# Patient Record
Sex: Female | Born: 1990 | Race: Black or African American | Hispanic: No | Marital: Single | Smoking: Former smoker
Health system: Southern US, Community
[De-identification: ages and names within clinical notes are randomized; demographics above are authoritative.]

## PROBLEM LIST (undated history)

## (undated) DIAGNOSIS — G43909 Migraine, unspecified, not intractable, without status migrainosus: Secondary | ICD-10-CM

## (undated) DIAGNOSIS — N939 Abnormal uterine and vaginal bleeding, unspecified: Secondary | ICD-10-CM

## (undated) HISTORY — DX: Migraine, unspecified, not intractable, without status migrainosus: G43.909

## (undated) HISTORY — DX: Abnormal uterine and vaginal bleeding, unspecified: N93.9

---
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
## 2010-02-23 ENCOUNTER — Emergency Department: Payer: Self-pay | Admitting: Emergency Medicine

## 2011-11-29 HISTORY — PX: PERIPHERAL VASCULAR THROMBECTOMY: CATH118306

## 2011-12-14 ENCOUNTER — Emergency Department: Payer: Self-pay | Admitting: *Deleted

## 2011-12-26 ENCOUNTER — Emergency Department: Payer: Self-pay | Admitting: Emergency Medicine

## 2012-03-02 ENCOUNTER — Emergency Department: Payer: Self-pay | Admitting: Emergency Medicine

## 2012-03-02 LAB — HCG, QUANTITATIVE, PREGNANCY: Beta Hcg, Quant.: <1 m[IU]/mL — ABNORMAL LOW

## 2012-04-26 ENCOUNTER — Emergency Department: Payer: Self-pay | Admitting: Emergency Medicine

## 2012-04-26 LAB — WET PREP, GENITAL

## 2012-04-26 LAB — URINALYSIS, COMPLETE
Bilirubin,UR: NEGATIVE
Blood: NEGATIVE
Glucose,UR: NEGATIVE mg/dL (ref 0–75)
Ketone: NEGATIVE
Nitrite: NEGATIVE
Ph: 6 (ref 4.5–8.0)
RBC,UR: 3 /HPF (ref 0–5)
Specific Gravity: 1.027 (ref 1.003–1.030)
WBC UR: 1 /HPF (ref 0–5)

## 2012-04-26 LAB — HCG, QUANTITATIVE, PREGNANCY: Beta Hcg, Quant.: 35643 m[IU]/mL — ABNORMAL HIGH

## 2013-07-24 ENCOUNTER — Emergency Department: Payer: Self-pay | Admitting: Emergency Medicine

## 2013-11-28 HISTORY — PX: HAND SURGERY: SHX662
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
## 2014-08-12 ENCOUNTER — Emergency Department: Payer: Self-pay | Admitting: General Practice

## 2014-08-19 ENCOUNTER — Emergency Department: Payer: Self-pay | Admitting: Student

## 2015-02-06 ENCOUNTER — Ambulatory Visit: Payer: Self-pay | Admitting: Specialist

## 2015-03-23 LAB — SURGICAL PATHOLOGY

## 2015-03-29 NOTE — Op Note (Signed)
PATIENT NAME:  Peggy Shaw, Peggy Shaw MR#:  [id number] DATE OF BIRTH:  October 17, 1991  DATE OF PROCEDURE:  02/06/2015  PREOPERATIVE DIAGNOSIS: Dorsal right wrist mass.  POSTOPERATIVE DIAGNOSIS: Dorsal right wrist mass.   OPERATIONAL PROCEDURE PERFORMED: Excision of dorsal right wrist mass.   SURGEON: Clare Gandyhristopher E. Gabrianna Fassnacht, MD   ANESTHESIA: General.   COMPLICATIONS: None.   TOURNIQUET TIME: 24 minutes.   DESCRIPTION OF PROCEDURE: Ancef 1 gram was given intravenously prior to the procedure. General anesthesia is induced. The right upper extremity is thoroughly prepped Betadine and alcohol, and draped in standard sterile fashion. The extremity is wrapped out with the Esmarch bandage and pneumatic tourniquet elevated to 250 mmHg. Transverse incision is then made under loupe magnification over the mid portion of the mass, which is directly in the center of the dorsum of the right wrist. The dissection is carefully carried down to the retinaculum, which is split transversely. The mass is seen to be a 1.5 cm x 1 cm reddish-brown hard mass. This was completely dissected out. This does not appear to have any extension down into the wrist capsule or wrist joint. Specimen is sent to pathology. The wound is thoroughly irrigated multiple times. Retinaculum is repaired with one 5-0 Vicryl, 1 subcutaneous 5-0 Vicryl suture is placed, a running subcuticular 3-0 Prolene is placed. Skin edges are infiltrated with 0.5% plain Xylocaine. A soft bulky dressing with a volar splint is applied. The patient is returned to the recovery room in satisfactory condition having tolerated the procedure quite well.     ____________________________ Clare Gandyhristopher E. Jacory Kamel, MD ces:mw D: 02/06/2015 08:49:56 ET T: 02/06/2015 17:00:04 ET JOB#: [id number]  cc: Clare Gandyhristopher E. Jeriah Corkum, MD, <Dictator> Clare GandyHRISTOPHER E Jianni Batten MD ELECTRONICALLY SIGNED 02/07/2015 10:07

## 2015-05-27 IMAGING — CR DG CHEST 2V
1 series · 2 of 2 positions shown · non-contrast
Comparison: none

REASON FOR EXAM: cough
COMMENTS:   LMP: One week ago

[Series 1: w chest pa · 0.14mm/px · 2 of 2 slices shown]
[im 1/2]
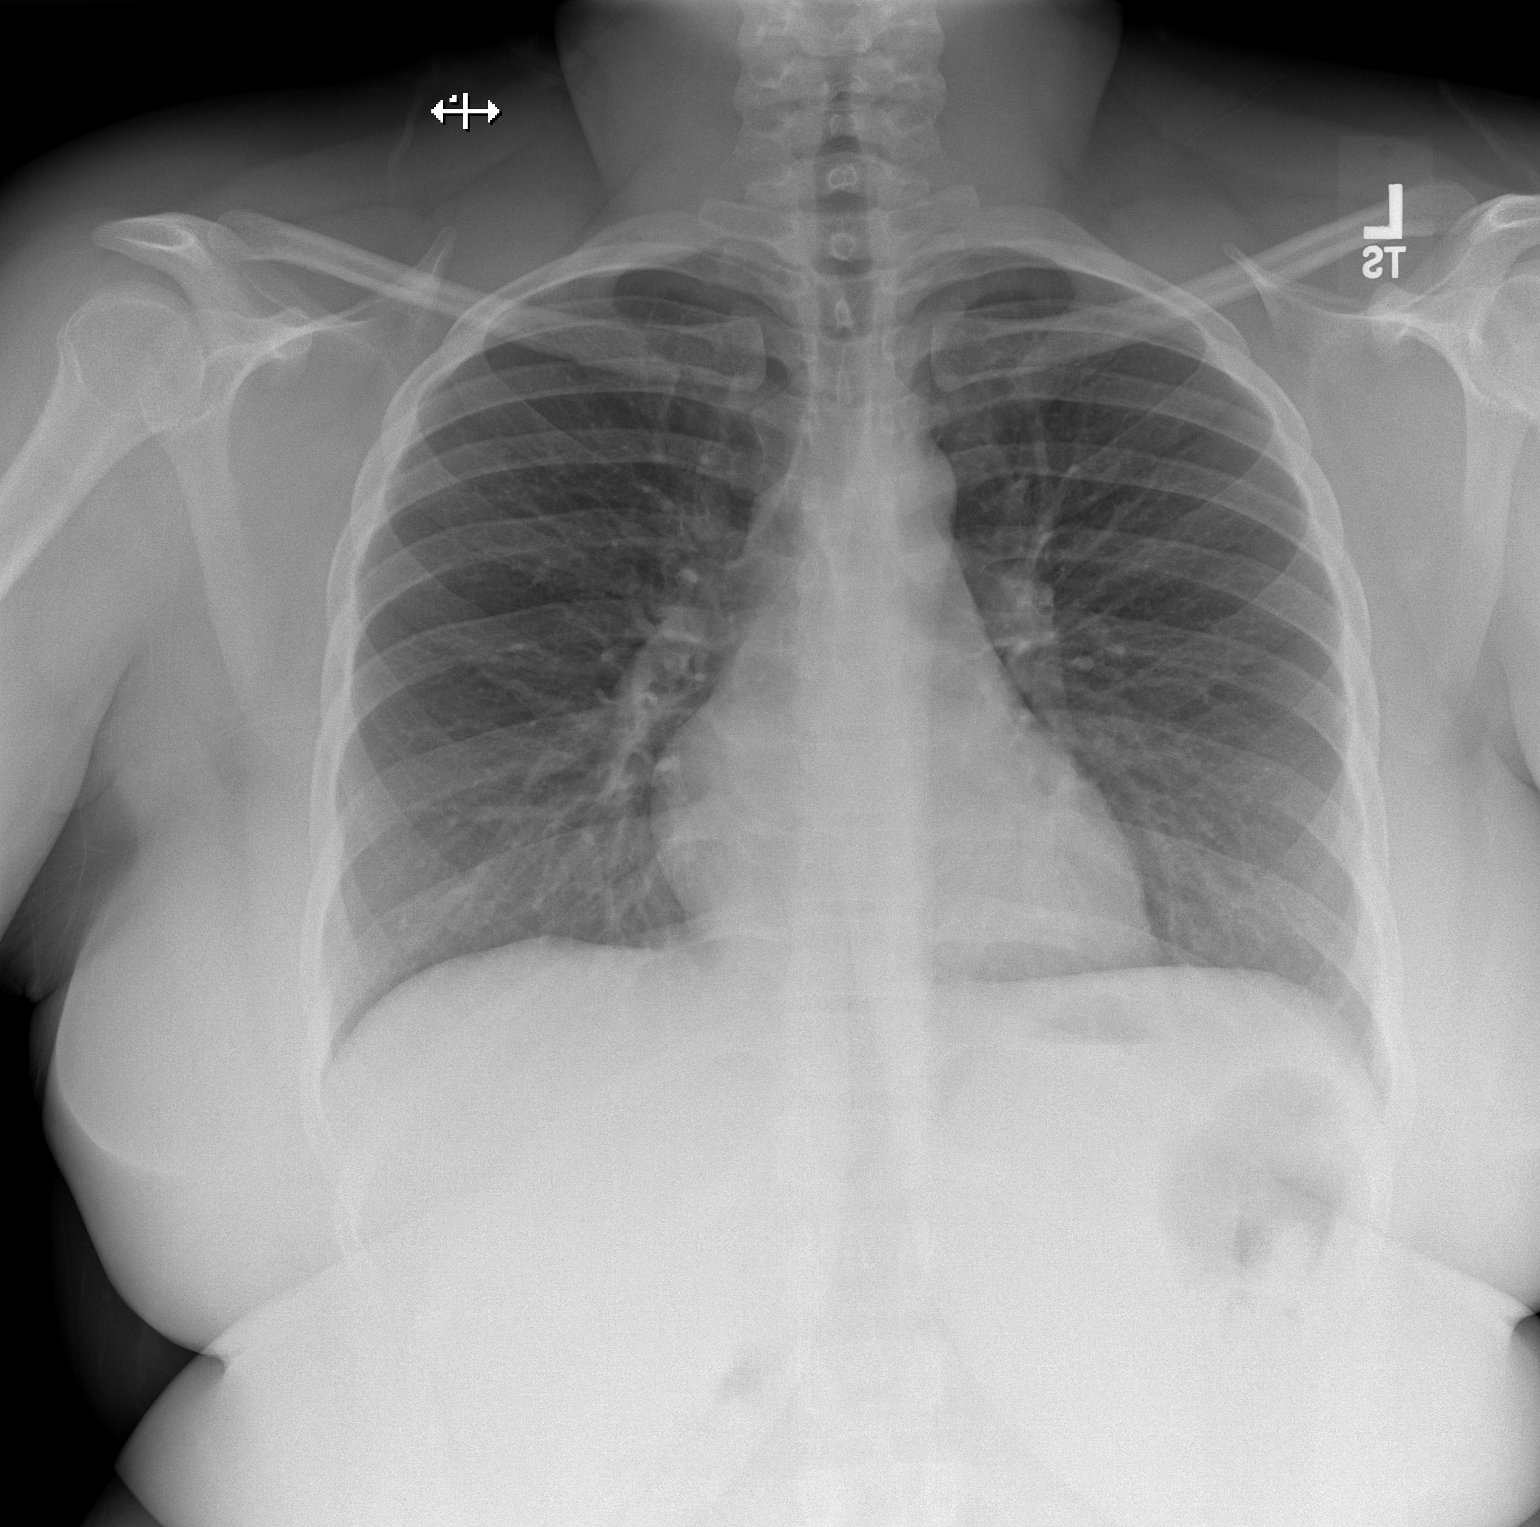
[im 2/2]
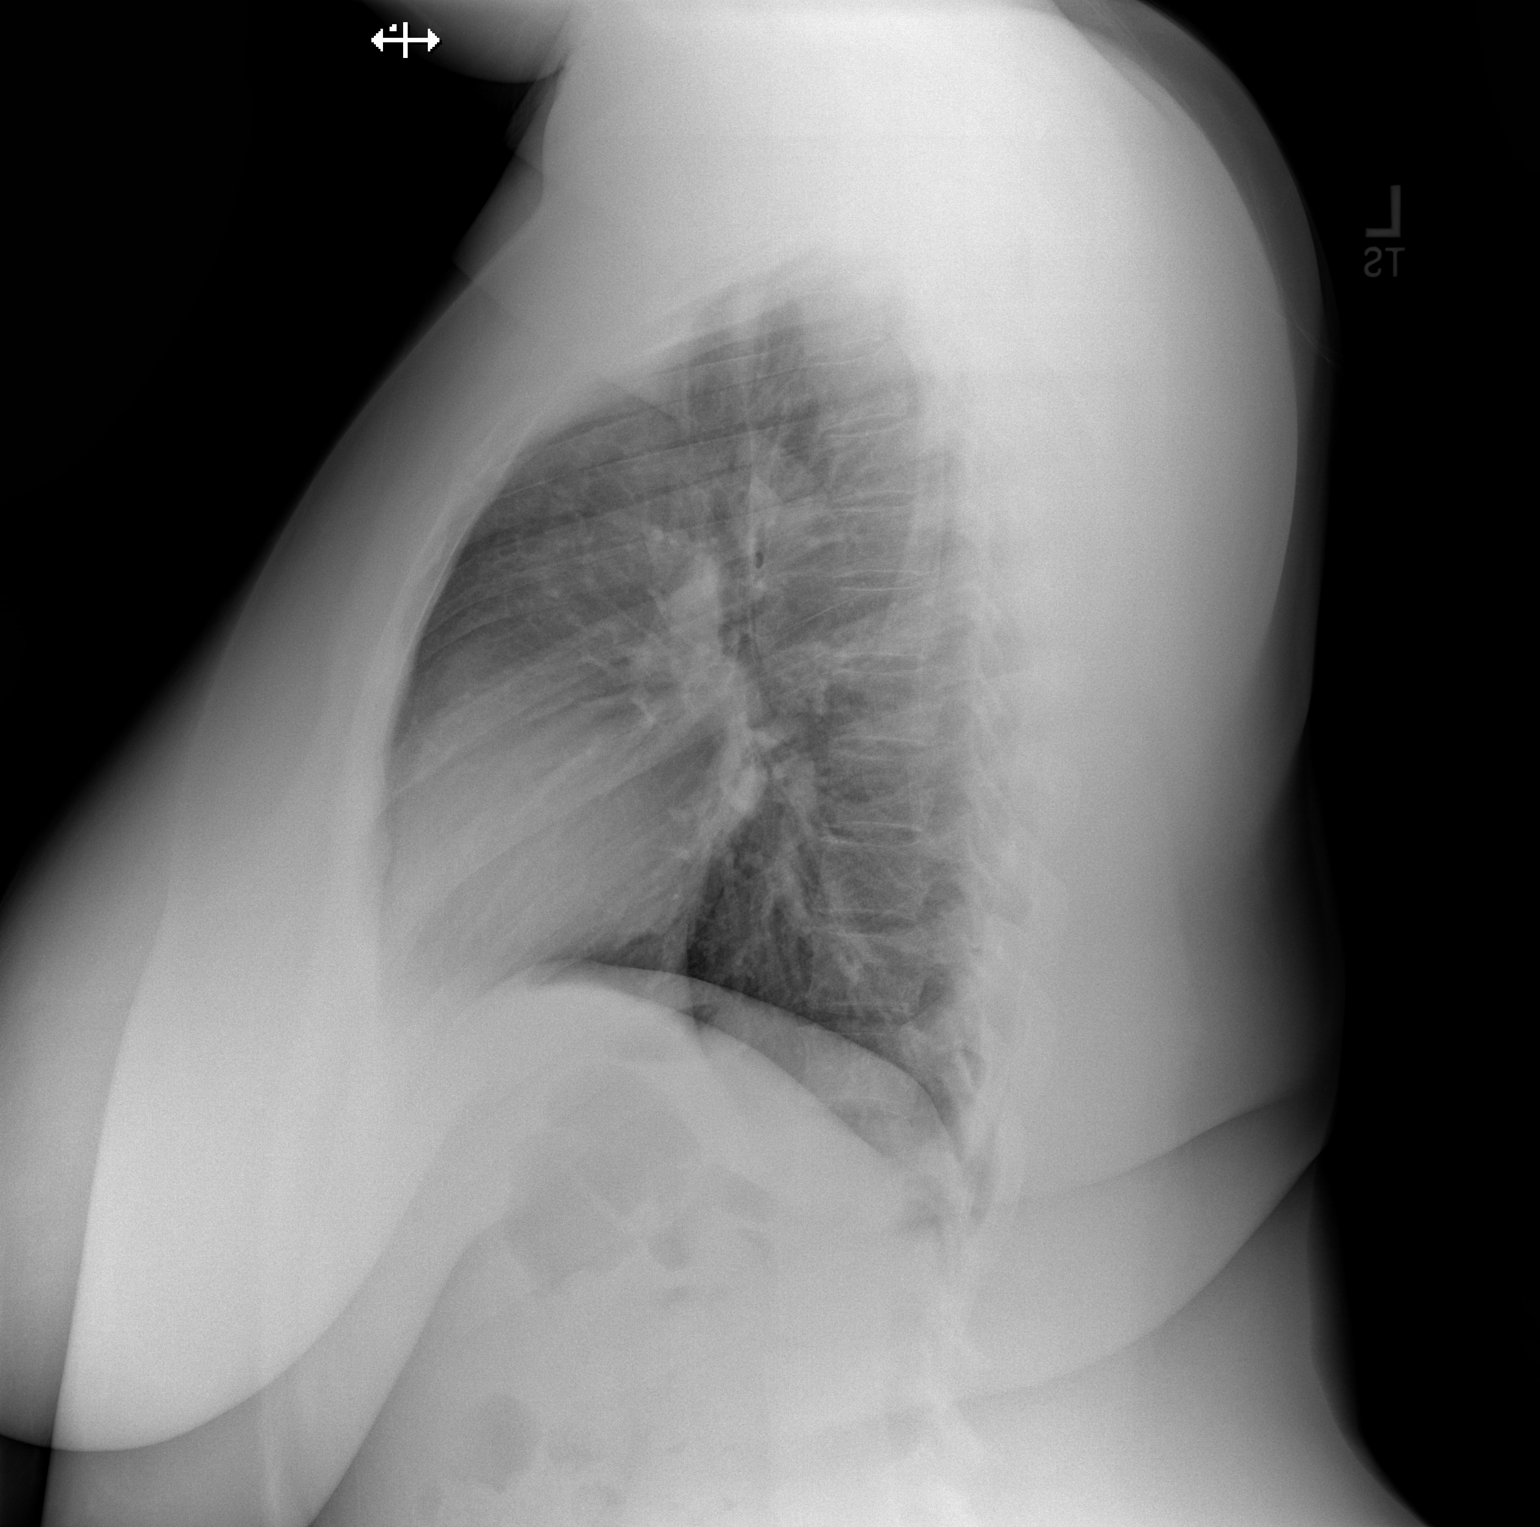

[2 of 2 positions shown; findings below may reference images not displayed]

PROCEDURE:     DXR - DXR CHEST PA (OR AP) AND LATERAL  - July 24, 2013 [DATE]

RESULT:     The lungs are adequately inflated and clear. The cardiac
silhouette is normal in size. The pulmonary vascularity is not engorged. The
mediastinum is normal in width. The bony thorax exhibits no acute
abnormality.
IMPRESSION: There is no evidence of pneumonia nor CHF nor other acute
cardiopulmonary abnormality.

[REDACTED]

## 2016-04-21 ENCOUNTER — Encounter: Payer: Self-pay | Admitting: Emergency Medicine

## 2016-04-21 ENCOUNTER — Emergency Department
Admission: EM | Admit: 2016-04-21 | Discharge: 2016-04-21 | Disposition: A | Discharge status: Home / Self Care | Payer: 59 | Attending: Emergency Medicine | Admitting: Emergency Medicine

## 2016-04-21 DIAGNOSIS — J020 Streptococcal pharyngitis: Secondary | ICD-10-CM | POA: Insufficient documentation

## 2016-04-21 DIAGNOSIS — F17200 Nicotine dependence, unspecified, uncomplicated: Secondary | ICD-10-CM | POA: Insufficient documentation

## 2016-04-21 DIAGNOSIS — R509 Fever, unspecified: Secondary | ICD-10-CM | POA: Diagnosis present

## 2016-04-21 LAB — POCT RAPID STREP A: STREPTOCOCCUS, GROUP A SCREEN (DIRECT): NEGATIVE

## 2016-04-21 MED ORDER — ACETAMINOPHEN 160 MG/5ML PO SOLN
650.0000 mg | Freq: Once | ORAL | Status: AC
  Administered 2016-04-21: 650 mg via ORAL

## 2016-04-21 MED ORDER — LIDOCAINE VISCOUS 2 % MT SOLN
15.0000 mL | Freq: Once | OROMUCOSAL | Status: AC
  Administered 2016-04-21: 15 mL via OROMUCOSAL
  Filled 2016-04-21: qty 15

## 2016-04-21 MED ORDER — ACETAMINOPHEN 160 MG/5ML PO SUSP
ORAL | Status: AC
  Administered 2016-04-21: 650 mg via ORAL
  Filled 2016-04-21: qty 20

## 2016-04-21 MED ORDER — ACETAMINOPHEN 500 MG PO TABS: 1000.0000 mg | ORAL_TABLET | Freq: Once | ORAL | Status: DC

## 2016-04-21 MED ORDER — PENICILLIN G BENZATHINE 1200000 UNIT/2ML IM SUSP
1.2000 10*6.[IU] | Freq: Once | INTRAMUSCULAR | Status: AC
  Administered 2016-04-21: 1.2 10*6.[IU] via INTRAMUSCULAR
  Filled 2016-04-21: qty 2

## 2016-04-21 NOTE — ED Provider Notes (Signed)
[facility] Emergency Department Provider Note ____________________________________________  Time seen: 59  I have reviewed the triage vital signs and the nursing notes.  HISTORY  Chief Complaint  Sore Throat; Otalgia; and Generalized Body Aches   HPI Peggy Shaw is a 25 y.o. female resistance to the ED for sudden onset of sore throat pain yesterday. She was unaware of fevers but found to be febrile upon registration. She gives a prior history of strep infection. She denies any sick Contacts or recent travel. She has only taking Benadryl for symptom relief. She left work early yesterday secondary to her symptoms.  History reviewed. No pertinent past medical history.  There are no active problems to display for this patient.  History reviewed. No pertinent past surgical history.  No current outpatient prescriptions on file.  Allergies Review of patient's allergies indicates no known allergies.  History reviewed. No pertinent family history.  Social History Social History  Substance Use Topics  . Smoking status: Current Every Day Smoker  . Smokeless tobacco: None  . Alcohol Use: Yes   Review of Systems  Constitutional: Positive for fever. Eyes: Negative for visual changes. ENT: Positive for sore throat. Cardiovascular: Negative for chest pain. Respiratory: Negative for shortness of breath. Gastrointestinal: Negative for abdominal pain, vomiting and diarrhea. Skin: Negative for rash. Neurological: Negative for headaches, focal weakness or numbness. ____________________________________________  PHYSICAL EXAM:  VITAL SIGNS: ED Triage Vitals  Enc Vitals Group     BP 04/21/16 0947 118/59 mmHg     Pulse Rate 04/21/16 0947 110     Resp 04/21/16 0947 20     Temp 04/21/16 0947 101.2 F (38.4 C)     Temp Source 04/21/16 0947 Oral     SpO2 04/21/16 0947 97 %     Weight 04/21/16 0947 235 lb (106.595 kg)     Height 04/21/16 0947  (1.626  m)     Head Cir --      Peak Flow --      Pain Score 04/21/16 0949 10     Pain Loc --      Pain Edu? --      Excl. in GC? --    Constitutional: Alert and oriented. Well appearing and in no distress. Head: Normocephalic and atraumatic.      Eyes: Conjunctivae are normal. PERRL. Normal extraocular movements      Ears: Canals clear. TMs intact bilaterally.   Nose: No congestion/rhinorrhea.   Mouth/Throat: Mucous membranes are moist. Uvula is midline. Tonsils are enlarged, erythematous, and exudative.    Neck: Supple. No thyromegaly. Hematological/Lymphatic/Immunological: palpable anterior cervical lymphadenopathy. Cardiovascular: Normal rate, regular rhythm.  Respiratory: Normal respiratory effort. No wheezes/rales/rhonchi. Gastrointestinal: Soft and nontender. No distention. Musculoskeletal: Nontender with normal range of motion in all extremities.  Neurologic:  Normal gait without ataxia. Normal speech and language. No gross focal neurologic deficits are appreciated. Skin:  Skin is warm, dry and intact. No rash noted. ____________________________________________   LABS (pertinent positives/negatives) Labs Reviewed  CULTURE, GROUP A STREP [facility])  POCT RAPID STREP A  ____________________________________________  PROCEDURES  Bicillin LA 1.2 million units IM Tylenol Suspension 650 mg PO Viscous lidocaine 2% swish & spit ____________________________________________  INITIAL IMPRESSION / ASSESSMENT AND PLAN / ED COURSE  Patient with treatment for an acute strep tonsillitis despite negative rapid strep test. Clinical presentation is consistent with strep pharyngitis. She is given Bicillin IM in the ED. She will continue to monitor and treat fevers as appropriate. She will  [facility]. ____________________________________________  FINAL CLINICAL IMPRESSION(S) / ED DIAGNOSES  Final diagnoses:  Strep sore throat     Lissa HoardJenise V Bacon  Gracelee Stemmler, PA-C 04/21/16 1103  Phineas SemenGraydon Goodman, MD 04/21/16 1331

## 2016-04-21 NOTE — Discharge Instructions (Signed)
Strep Throat °Strep throat is a bacterial infection of the throat. Your health care provider may call the infection tonsillitis or pharyngitis, depending on whether there is swelling in the tonsils or at the back of the throat. Strep throat is most common during the cold months of the year in children who are 5-25 years of age, but it can happen during any season in people of any age. This infection is spread from person to person (contagious) through coughing, sneezing, or close contact. °CAUSES °Strep throat is caused by the bacteria called Streptococcus pyogenes. °RISK FACTORS °This condition is more likely to develop in: °· People who spend time in crowded places where the infection can spread easily. °· People who have close contact with someone who has strep throat. °SYMPTOMS °Symptoms of this condition include: °· Fever or chills.   °· Redness, swelling, or pain in the tonsils or throat. °· Pain or difficulty when swallowing. °· White or yellow spots on the tonsils or throat. °· Swollen, tender glands in the neck or under the jaw. °· Red rash all over the body (rare). °DIAGNOSIS °This condition is diagnosed by performing a rapid strep test or by taking a swab of your throat (throat culture test). Results from a rapid strep test are usually ready in a few minutes, but throat culture test results are available after one or two days. °TREATMENT °This condition is treated with antibiotic medicine. °HOME CARE INSTRUCTIONS °Medicines °· Take over-the-counter and prescription medicines only as told by your health care provider. °· Take your antibiotic as told by your health care provider. Do not stop taking the antibiotic even if you start to feel better. °· Have family members who also have a sore throat or fever tested for strep throat. They may need antibiotics if they have the strep infection. °Eating and Drinking °· Do not share food, drinking cups, or personal items that could cause the infection to spread to  other people. °· If swallowing is difficult, try eating soft foods until your sore throat feels better. °· Drink enough fluid to keep your urine clear or pale yellow. °General Instructions °· Gargle with a salt-water mixture 3-4 times per day or as needed. To make a salt-water mixture, completely dissolve ½-1 tsp of salt in 1 cup of warm water. °· Make sure that all household members wash their hands well. °· Get plenty of rest. °· Stay home from school or work until you have been taking antibiotics for 24 hours. °· Keep all follow-up visits as told by your health care provider. This is important. °SEEK MEDICAL CARE IF: °· The glands in your neck continue to get bigger. °· You develop a rash, cough, or earache. °· You cough up a thick liquid that is green, yellow-brown, or bloody. °· You have pain or discomfort that does not get better with medicine. °· Your problems seem to be getting worse rather than better. °· You have a fever. °SEEK IMMEDIATE MEDICAL CARE IF: °· You have new symptoms, such as vomiting, severe headache, stiff or painful neck, chest pain, or shortness of breath. °· You have severe throat pain, drooling, or changes in your voice. °· You have swelling of the neck, or the skin on the neck becomes red and tender. °· You have signs of dehydration, such as fatigue, dry mouth, and decreased urination. °· You become increasingly sleepy, or you cannot wake up completely. °· Your joints become red or painful. °  °This information is not intended to replace   advice given to you by your health care provider. Make sure you discuss any questions you have with your health care provider.   Document Released: 11/11/2000 Document Revised: 08/05/2015 Document Reviewed: 03/09/2015 Elsevier Interactive Patient Education 2016 [employer]   You have been treated for strep throat with a one-time antibiotic injection. Continue to monitor and treat fevers with Tylenol and Ibuprofen. Rinse with warm salty water,  and change your toothbrush in 24-hours. [facility] for continued symptoms. Your throat culture is pending.

## 2016-04-21 NOTE — ED Notes (Signed)
Pt to ed with c/o body aches, chills, sore throat and earache x 2 days.

## 2016-04-21 NOTE — ED Notes (Signed)
Sore throat with fever for couple of days  Increased pain with swallowing  Throat red and swollen

## 2016-04-23 LAB — CULTURE, GROUP A STREP (THRC)

## 2016-10-04 ENCOUNTER — Emergency Department: Payer: 59

## 2016-10-04 ENCOUNTER — Emergency Department
Admission: EM | Admit: 2016-10-04 | Discharge: 2016-10-04 | Disposition: A | Discharge status: Home / Self Care | Payer: 59 | Attending: Student in an Organized Health Care Education/Training Program | Admitting: Student in an Organized Health Care Education/Training Program

## 2016-10-04 ENCOUNTER — Encounter: Payer: Self-pay | Admitting: Emergency Medicine

## 2016-10-04 DIAGNOSIS — O209 Hemorrhage in early pregnancy, unspecified: Secondary | ICD-10-CM

## 2016-10-04 DIAGNOSIS — F17200 Nicotine dependence, unspecified, uncomplicated: Secondary | ICD-10-CM | POA: Insufficient documentation

## 2016-10-04 DIAGNOSIS — R102 Pelvic and perineal pain: Secondary | ICD-10-CM | POA: Insufficient documentation

## 2016-10-04 DIAGNOSIS — O26851 Spotting complicating pregnancy, first trimester: Secondary | ICD-10-CM | POA: Diagnosis not present

## 2016-10-04 DIAGNOSIS — O99331 Smoking (tobacco) complicating pregnancy, first trimester: Secondary | ICD-10-CM | POA: Insufficient documentation

## 2016-10-04 DIAGNOSIS — Z3A01 Less than 8 weeks gestation of pregnancy: Secondary | ICD-10-CM | POA: Diagnosis not present

## 2016-10-04 DIAGNOSIS — N939 Abnormal uterine and vaginal bleeding, unspecified: Secondary | ICD-10-CM

## 2016-10-04 LAB — COMPREHENSIVE METABOLIC PANEL
ALT: 13 U/L — ABNORMAL LOW (ref 14–54)
ANION GAP: 6 (ref 5–15)
AST: 15 U/L (ref 15–41)
Albumin: 3.7 g/dL (ref 3.5–5.0)
Alkaline Phosphatase: 77 U/L (ref 38–126)
BUN: 16 mg/dL (ref 6–20)
CHLORIDE: 103 mmol/L (ref 101–111)
CO2: 25 mmol/L (ref 22–32)
Calcium: 9.4 mg/dL (ref 8.9–10.3)
Creatinine, Ser: 0.77 mg/dL (ref 0.44–1.00)
Glucose, Bld: 101 mg/dL — ABNORMAL HIGH (ref 65–99)
POTASSIUM: 3.7 mmol/L (ref 3.5–5.1)
Sodium: 134 mmol/L — ABNORMAL LOW (ref 135–145)
Total Bilirubin: <0.1 mg/dL — ABNORMAL LOW (ref 0.3–1.2)
Total Protein: 7.2 g/dL (ref 6.5–8.1)

## 2016-10-04 LAB — URINALYSIS COMPLETE WITH MICROSCOPIC (ARMC ONLY)
BACTERIA UA: NONE SEEN
BILIRUBIN URINE: NEGATIVE
Glucose, UA: NEGATIVE mg/dL
Ketones, ur: NEGATIVE mg/dL
Nitrite: NEGATIVE
PH: 6 (ref 5.0–8.0)
Protein, ur: NEGATIVE mg/dL
Specific Gravity, Urine: 1.019 (ref 1.005–1.030)

## 2016-10-04 LAB — HCG, QUANTITATIVE, PREGNANCY: HCG, BETA CHAIN, QUANT, S: 66934 m[IU]/mL — AB (ref <5)

## 2016-10-04 LAB — CBC
HEMATOCRIT: 33.8 % — AB (ref 35.0–47.0)
HEMOGLOBIN: 11 g/dL — AB (ref 12.0–16.0)
MCH: 25.9 pg — ABNORMAL LOW (ref 26.0–34.0)
MCHC: 32.7 g/dL (ref 32.0–36.0)
MCV: 79.1 fL — AB (ref 80.0–100.0)
PLATELETS: 333 10*3/uL (ref 150–440)
RBC: 4.27 MIL/uL (ref 3.80–5.20)
RDW: 15.7 % — ABNORMAL HIGH (ref 11.5–14.5)
WBC: 9.7 10*3/uL (ref 3.6–11.0)

## 2016-10-04 LAB — LIPASE, BLOOD: LIPASE: 26 U/L (ref 11–51)

## 2016-10-04 LAB — ABO/RH: ABO/RH(D): B POS

## 2016-10-04 LAB — POCT PREGNANCY, URINE: Preg Test, Ur: POSITIVE — AB

## 2016-10-04 MED ORDER — ACETAMINOPHEN 500 MG PO TABS
1000.0000 mg | ORAL_TABLET | Freq: Once | ORAL | Status: AC
  Administered 2016-10-04: 1000 mg via ORAL
  Filled 2016-10-04: qty 2

## 2016-10-04 NOTE — ED Provider Notes (Signed)
[facility] Emergency Department Provider Note    First MD Initiated Contact with Patient 10/04/16 1859     (approximate)  I have reviewed the triage vital signs and the nursing notes.   HISTORY  Chief Complaint Abdominal Pain    HPI Peggy Shaw is a 25 y.o. female G2P1 who presents with 3 days of lower back pain as well as lower abdominal pain the patient states is consistent with uterine cramping.  States her last missed a cycle was roughly 2 months ago. States she has a history of irregular menses.No history of bleeding disorders.   History reviewed. No pertinent past medical history.  There are no active problems to display for this patient.   History reviewed. No pertinent surgical history.  Prior to Admission medications   Not on File    Allergies Patient has no known allergies.  No fam h/o bleeding disorders  Social History Social History  Substance Use Topics  . Smoking status: Current Every Day Smoker  . Smokeless tobacco: Never Used  . Alcohol use Yes    Review of Systems Patient denies headaches, rhinorrhea, blurry vision, numbness, shortness of breath, chest pain, edema, cough, abdominal pain, nausea, vomiting, diarrhea, dysuria, fevers, rashes or hallucinations unless otherwise stated above in HPI. ____________________________________________   PHYSICAL EXAM:  VITAL SIGNS: Vitals:   10/04/16 1716 10/04/16 2110  BP: 125/60 118/66  Pulse: 72 90  Resp: 18 16  Temp: 98.4 F (36.9 C) 98.3 F (36.8 C)    Constitutional: Alert and oriented. Well appearing and in no acute distress. Eyes: Conjunctivae are normal. PERRL. EOMI. Head: Atraumatic. Nose: No congestion/rhinnorhea. Mouth/Throat: Mucous membranes are moist.  Oropharynx non-erythematous. Neck: No stridor. Painless ROM. No cervical spine tenderness to palpation Hematological/Lymphatic/Immunilogical: No cervical lymphadenopathy. Cardiovascular: Normal rate,  regular rhythm. Grossly normal heart sounds.  Good peripheral circulation. Respiratory: Normal respiratory effort.  No retractions. Lungs CTAB. Gastrointestinal: Soft and nontender. No distention. No abdominal bruits. No CVA tenderness. Genitourinary:  Musculoskeletal: No lower extremity tenderness nor edema.  No joint effusions. Neurologic:  Normal speech and language. No gross focal neurologic deficits are appreciated. No gait instability. Skin:  Skin is warm, dry and intact. No rash noted. Psychiatric: Mood and affect are normal. Speech and behavior are normal.  ____________________________________________   LABS (all labs ordered are listed, but only abnormal results are displayed)  Results for orders placed or performed during the hospital encounter of 10/04/16 (from the past 24 hour(s))  Lipase, blood     Status: None   Collection Time: 10/04/16  5:21 PM  Result Value Ref Range   Lipase 26 11 - 51 U/L  Comprehensive metabolic panel     Status: Abnormal   Collection Time: 10/04/16  5:21 PM  Result Value Ref Range   Sodium 134 (L) 135 - 145 mmol/L   Potassium 3.7 3.5 - 5.1 mmol/L   Chloride 103 101 - 111 mmol/L   CO2 25 22 - 32 mmol/L   Glucose, Bld 101 (H) 65 - 99 mg/dL   BUN 16 6 - 20 mg/dL   Creatinine, Ser 0.980.77 0.44 - 1.00 mg/dL   Calcium 9.4 8.9 - 11.910.3 mg/dL   Total Protein 7.2 6.5 - 8.1 g/dL   Albumin 3.7 3.5 - 5.0 g/dL   AST 15 15 - 41 U/L   ALT 13 (L) 14 - 54 U/L   Alkaline Phosphatase 77 38 - 126 U/L   Total Bilirubin <0.1 (L) 0.3 - 1.2  mg/dL   GFR calc non Af Amer >60 >60 mL/min   GFR calc Af Amer >60 >60 mL/min   Anion gap 6 5 - 15  CBC     Status: Abnormal   Collection Time: 10/04/16  5:21 PM  Result Value Ref Range   WBC 9.7 3.6 - 11.0 K/uL   RBC 4.27 3.80 - 5.20 MIL/uL   Hemoglobin 11.0 (L) 12.0 - 16.0 g/dL   HCT 16.133.8 (L) 09.635.0 - 04.547.0 %   MCV 79.1 (L) 80.0 - 100.0 fL   MCH 25.9 (L) 26.0 - 34.0 pg   MCHC 32.7 32.0 - 36.0 g/dL   RDW 40.915.7 (H) 81.111.5 - 91.414.5  %   Platelets 333 150 - 440 K/uL  Urinalysis complete, with microscopic     Status: Abnormal   Collection Time: 10/04/16  5:21 PM  Result Value Ref Range   Color, Urine YELLOW (A) YELLOW   APPearance HAZY (A) CLEAR   Glucose, UA NEGATIVE NEGATIVE mg/dL   Bilirubin Urine NEGATIVE NEGATIVE   Ketones, ur NEGATIVE NEGATIVE mg/dL   Specific Gravity, Urine 1.019 1.005 - 1.030   Hgb urine dipstick 3+ (A) NEGATIVE   pH 6.0 5.0 - 8.0   Protein, ur NEGATIVE NEGATIVE mg/dL   Nitrite NEGATIVE NEGATIVE   Leukocytes, UA 2+ (A) NEGATIVE   RBC / HPF 0-5 0 - 5 RBC/hpf   WBC, UA 0-5 0 - 5 WBC/hpf   Bacteria, UA NONE SEEN NONE SEEN   Squamous Epithelial / LPF 0-5 (A) NONE SEEN   Mucous PRESENT   hCG, quantitative, pregnancy     Status: Abnormal   Collection Time: 10/04/16  5:21 PM  Result Value Ref Range   hCG, Beta Chain, Quant, S [id number],29566,934 (H) <5 mIU/mL  ABO/Rh     Status: None   Collection Time: 10/04/16  5:21 PM  Result Value Ref Range   ABO/RH(D) B POS   Pregnancy, urine POC     Status: Abnormal   Collection Time: 10/04/16  5:27 PM  Result Value Ref Range   Preg Test, Ur POSITIVE (A) NEGATIVE   ____________________________________________  EKG____________________________________________  RADIOLOGY  I personally reviewed all radiographic images ordered to evaluate for the above acute complaints and reviewed radiology reports and findings.  These findings were personally discussed with the patient.  Please see medical record for radiology report.  ____________________________________________   PROCEDURES  Procedure(s) performed: none    Critical Care performed: no ____________________________________________   INITIAL IMPRESSION / ASSESSMENT AND PLAN / ED COURSE  Pertinent labs & imaging results that were available during my care of the patient were reviewed by me and considered in my medical decision making (see chart for details).  DDX: ectopic, miscarriage, threatened  Ab  Peggy Shaw is a 25 y.o. who presents to the ED with Lower abdominal pain and back pain with vaginal spotting. Blood work in triage shows evidence of pregnancy. Otherwise reassuring. We'll exam is soft and benign. Based on her bleeding have recommended the patient stay for a transvaginal ultrasound to evaluate for ectopic pregnancy. Patient is otherwise hemodynamic stable and well-appearing.  Clinical Course as of Oct 04 2121  Tue Oct 04, 2016  2012 Pertinent patient requesting discharge home. Bedside ultrasound does show intrauterine gestational sac and fetal pole without any free fluid. I advised the patient to stay for formal ultrasound but the patient is requesting discharge. She does state understanding that discharged prior to full workup could result in worsening of condition  including pain and even death. Patient agrees to follow-up with her OB/GYN.  Patient was able to tolerate PO and was able to ambulate with a steady gait.  Have discussed with the patient and available family all diagnostics and treatments performed thus far and all questions were answered to the best of my ability. The patient demonstrates understanding and agreement with plan.   [PR]  2013 HCG, Beta Francene Finders 40,102 [PR]    Clinical Course User Index [PR] Willy Eddy, MD     ____________________________________________   FINAL CLINICAL IMPRESSION(S) / ED DIAGNOSES  Final diagnoses:  Vaginal bleeding in pregnancy, first trimester      NEW MEDICATIONS STARTED DURING THIS VISIT:  There are no discharge medications for this patient.    Note:  This document was prepared using Dragon voice recognition software and may include unintentional dictation errors.    Willy Eddy, MD 10/04/16 2122

## 2016-10-04 NOTE — ED Triage Notes (Addendum)
Pt c/o lower abd pain that radiates into the lower back for the past 3 days.. Denies N/V/D or painful urination.. Pt c/o constipation, states she had a small BM this morning..Marland Kitchen

## 2016-10-05 ENCOUNTER — Telehealth: Payer: Self-pay | Admitting: Emergency Medicine

## 2016-10-05 NOTE — Telephone Encounter (Signed)
Called patient to inform that test results came back after she left the ED.  She has not made appt with [facility] yet.  I advised her to call them now and inform them that she has results for them to review from her ED visit.  Also called [facility] to inform she will be calling and that there were additional tests done that may not have been included in referral fax.

## 2016-11-28 NOTE — L&D Delivery Note (Signed)
Delivery Note At 11:14 PM a viable female was delivered via Vaginal, Spontaneous Delivery (Presentation: ROA).  APGAR: 8, 9: weight pending.   Placenta status: delivered spontaneously.  Cord: intact, 3VC with the following complications: none.  Cord pH: none  Anesthesia:  epidural Episiotomy: None Lacerations:  none Suture Repair: n/a Est. Blood Loss (mL): 350  Mom to postpartum.  Baby to Couplet care / Skin to Skin.  Called to see patient.  Mom pushed to deliver a viable female infant.  The head followed by shoulders, which delivered without difficulty, and the rest of the body.  A single nuchal cord noted and delivered through.  Baby to mom's chest.  Cord clamped and cut after > 1 min delay.  No cord blood obtained.  Placenta delivered spontaneously, intact, with a 3-vessel cord.  No vaginal, cervical, or perineal lacerations. All counts correct.  Hemostasis obtained with IV pitocin and fundal massage. EBL 350 mL.    Thomasene MohairStephen Haileyann Staiger, MD 05/12/2017, 11:27 PM

## 2016-12-17 LAB — OB RESULTS CONSOLE VARICELLA ZOSTER ANTIBODY, IGG: VARICELLA IGG: IMMUNE

## 2016-12-17 LAB — OB RESULTS CONSOLE HEPATITIS B SURFACE ANTIGEN: Hepatitis B Surface Ag: NEGATIVE

## 2016-12-17 LAB — OB RESULTS CONSOLE RUBELLA ANTIBODY, IGM: Rubella: NON-IMMUNE/NOT IMMUNE

## 2016-12-17 LAB — OB RESULTS CONSOLE RPR: RPR: NONREACTIVE

## 2016-12-17 LAB — OB RESULTS CONSOLE HIV ANTIBODY (ROUTINE TESTING): HIV: NONREACTIVE

## 2017-01-30 ENCOUNTER — Encounter: Payer: Self-pay | Admitting: Certified Nurse Midwife

## 2017-01-30 ENCOUNTER — Ambulatory Visit (INDEPENDENT_AMBULATORY_CARE_PROVIDER_SITE_OTHER): Payer: 59 | Admitting: Certified Nurse Midwife

## 2017-01-30 VITALS — BP 100/60 | Wt 243.0 lb

## 2017-01-30 DIAGNOSIS — O99210 Obesity complicating pregnancy, unspecified trimester: Secondary | ICD-10-CM

## 2017-01-30 DIAGNOSIS — O0993 Supervision of high risk pregnancy, unspecified, third trimester: Secondary | ICD-10-CM | POA: Insufficient documentation

## 2017-01-30 DIAGNOSIS — Z369 Encounter for antenatal screening, unspecified: Secondary | ICD-10-CM

## 2017-01-30 DIAGNOSIS — O0992 Supervision of high risk pregnancy, unspecified, second trimester: Secondary | ICD-10-CM

## 2017-01-30 DIAGNOSIS — B3731 Acute candidiasis of vulva and vagina: Secondary | ICD-10-CM

## 2017-01-30 DIAGNOSIS — B373 Candidiasis of vulva and vagina: Secondary | ICD-10-CM | POA: Diagnosis not present

## 2017-01-30 DIAGNOSIS — Z3490 Encounter for supervision of normal pregnancy, unspecified, unspecified trimester: Secondary | ICD-10-CM | POA: Insufficient documentation

## 2017-01-30 DIAGNOSIS — L292 Pruritus vulvae: Secondary | ICD-10-CM

## 2017-01-30 DIAGNOSIS — Z6841 Body Mass Index (BMI) 40.0 and over, adult: Secondary | ICD-10-CM | POA: Insufficient documentation

## 2017-01-30 LAB — POCT WET PREP (WET MOUNT): Trichomonas Wet Prep HPF POC: ABSENT

## 2017-01-30 MED ORDER — TERCONAZOLE 0.8 % VA CREA: 1.0000 | TOPICAL_CREAM | Freq: Every day | VAGINAL | 0 refills | Status: DC

## 2017-01-30 NOTE — Progress Notes (Signed)
Complains of vulvar irritation and itching x 1 week. Had started after shaving and using a new soap Baby active. Vulva inflamed and swollen R labia majora>left . Vagina: white discharge. Wet prep positive for hyphae, negative for Trich and clue cells RX for Terazol 3 with instructions for use. Change soap back to [place] in 4 weeks for growth scan, one hour GTT and ROB

## 2017-01-30 NOTE — Progress Notes (Signed)
Pt c/o vaginal irritation, feels raw. Unsure if caused from shaving or different soap.

## 2017-02-27 ENCOUNTER — Other Ambulatory Visit: Payer: 59

## 2017-02-27 ENCOUNTER — Ambulatory Visit (INDEPENDENT_AMBULATORY_CARE_PROVIDER_SITE_OTHER): Payer: 59

## 2017-02-27 ENCOUNTER — Ambulatory Visit (INDEPENDENT_AMBULATORY_CARE_PROVIDER_SITE_OTHER): Payer: 59 | Admitting: Advanced Practice Midwife

## 2017-02-27 VITALS — BP 114/70 | Wt 250.0 lb

## 2017-02-27 DIAGNOSIS — O0992 Supervision of high risk pregnancy, unspecified, second trimester: Secondary | ICD-10-CM | POA: Diagnosis not present

## 2017-02-27 DIAGNOSIS — Z3A28 28 weeks gestation of pregnancy: Secondary | ICD-10-CM

## 2017-02-27 DIAGNOSIS — Z6841 Body Mass Index (BMI) 40.0 and over, adult: Secondary | ICD-10-CM | POA: Diagnosis not present

## 2017-02-27 DIAGNOSIS — O99210 Obesity complicating pregnancy, unspecified trimester: Secondary | ICD-10-CM

## 2017-02-27 DIAGNOSIS — Z369 Encounter for antenatal screening, unspecified: Secondary | ICD-10-CM

## 2017-02-27 NOTE — Progress Notes (Signed)
Growth scan and 28 week labs today.

## 2017-02-27 NOTE — Progress Notes (Signed)
Doing well. Reminded of good body mechanics when going from lying to sitting. Return in 2 weeks.

## 2017-02-28 LAB — 28 WEEK RH+PANEL
Basophils Absolute: 0 10*3/uL (ref 0.0–0.2)
Basos: 0 %
EOS (ABSOLUTE): 0.1 10*3/uL (ref 0.0–0.4)
Eos: 1 %
Gestational Diabetes Screen: 125 mg/dL (ref 65–139)
HIV Screen 4th Generation wRfx: NONREACTIVE
Hematocrit: 30.5 % — ABNORMAL LOW (ref 34.0–46.6)
Hemoglobin: 9.8 g/dL — ABNORMAL LOW (ref 11.1–15.9)
Immature Grans (Abs): 0 10*3/uL (ref 0.0–0.1)
Immature Granulocytes: 0 %
Lymphocytes Absolute: 1.9 10*3/uL (ref 0.7–3.1)
Lymphs: 18 %
MCH: 26.3 pg — ABNORMAL LOW (ref 26.6–33.0)
MCHC: 32.1 g/dL (ref 31.5–35.7)
MCV: 82 fL (ref 79–97)
Monocytes Absolute: 0.4 10*3/uL (ref 0.1–0.9)
Monocytes: 4 %
Neutrophils Absolute: 8 10*3/uL — ABNORMAL HIGH (ref 1.4–7.0)
Neutrophils: 77 %
Platelets: 288 10*3/uL (ref 150–379)
RBC: 3.72 x10E6/uL — ABNORMAL LOW (ref 3.77–5.28)
RDW: 14.5 % (ref 12.3–15.4)
RPR Ser Ql: NONREACTIVE
WBC: 10.4 10*3/uL (ref 3.4–10.8)

## 2017-03-08 ENCOUNTER — Telehealth: Payer: Self-pay | Admitting: Certified Nurse Midwife

## 2017-03-08 ENCOUNTER — Other Ambulatory Visit: Payer: Self-pay | Admitting: Certified Nurse Midwife

## 2017-03-08 MED ORDER — FERROUS SULFATE 325 (65 FE) MG PO TABS: 325.0000 mg | ORAL_TABLET | Freq: Every day | ORAL | 3 refills | Status: DC

## 2017-03-08 NOTE — Telephone Encounter (Signed)
Patient called with recent lab results. Is mildly anemic. Taking Provida OB. Will add ferrous sulfate 325 mgm daily. Can take stool softener if constipation problems. Increase fluids and roughage in diet if needed.

## 2017-03-13 ENCOUNTER — Ambulatory Visit (INDEPENDENT_AMBULATORY_CARE_PROVIDER_SITE_OTHER): Payer: 59 | Admitting: Obstetrics and Gynecology

## 2017-03-13 VITALS — BP 112/74 | Wt 250.0 lb

## 2017-03-13 DIAGNOSIS — O99019 Anemia complicating pregnancy, unspecified trimester: Secondary | ICD-10-CM | POA: Insufficient documentation

## 2017-03-13 DIAGNOSIS — D509 Iron deficiency anemia, unspecified: Secondary | ICD-10-CM | POA: Insufficient documentation

## 2017-03-13 DIAGNOSIS — Z6841 Body Mass Index (BMI) 40.0 and over, adult: Secondary | ICD-10-CM

## 2017-03-13 DIAGNOSIS — Z3A30 30 weeks gestation of pregnancy: Secondary | ICD-10-CM

## 2017-03-13 DIAGNOSIS — O99213 Obesity complicating pregnancy, third trimester: Secondary | ICD-10-CM | POA: Insufficient documentation

## 2017-03-13 DIAGNOSIS — O0993 Supervision of high risk pregnancy, unspecified, third trimester: Secondary | ICD-10-CM

## 2017-03-13 DIAGNOSIS — O99210 Obesity complicating pregnancy, unspecified trimester: Secondary | ICD-10-CM | POA: Insufficient documentation

## 2017-03-13 DIAGNOSIS — O99013 Anemia complicating pregnancy, third trimester: Secondary | ICD-10-CM

## 2017-03-13 NOTE — Progress Notes (Signed)
Lower back pain since last visit.  No vb. No lof.  Growth u/s nv.

## 2017-03-29 ENCOUNTER — Ambulatory Visit (INDEPENDENT_AMBULATORY_CARE_PROVIDER_SITE_OTHER): Payer: 59 | Admitting: Advanced Practice Midwife

## 2017-03-29 ENCOUNTER — Ambulatory Visit (INDEPENDENT_AMBULATORY_CARE_PROVIDER_SITE_OTHER): Payer: 59

## 2017-03-29 VITALS — BP 110/70 | Wt 251.0 lb

## 2017-03-29 DIAGNOSIS — O0993 Supervision of high risk pregnancy, unspecified, third trimester: Secondary | ICD-10-CM | POA: Diagnosis not present

## 2017-03-29 DIAGNOSIS — Z6841 Body Mass Index (BMI) 40.0 and over, adult: Secondary | ICD-10-CM | POA: Diagnosis not present

## 2017-03-29 DIAGNOSIS — O99213 Obesity complicating pregnancy, third trimester: Secondary | ICD-10-CM

## 2017-03-29 DIAGNOSIS — Z3A32 32 weeks gestation of pregnancy: Secondary | ICD-10-CM

## 2017-03-29 NOTE — Progress Notes (Signed)
Growth u/s today baby is 4#10oz 56%, AFI 10cm. Doing well today. No complaints or questions. Discussion of FKCs.

## 2017-04-14 ENCOUNTER — Ambulatory Visit (INDEPENDENT_AMBULATORY_CARE_PROVIDER_SITE_OTHER): Payer: 59 | Admitting: Advanced Practice Midwife

## 2017-04-14 VITALS — BP 112/56 | Wt 255.0 lb

## 2017-04-14 DIAGNOSIS — Z23 Encounter for immunization: Secondary | ICD-10-CM | POA: Diagnosis not present

## 2017-04-14 DIAGNOSIS — Z3A34 34 weeks gestation of pregnancy: Secondary | ICD-10-CM

## 2017-04-14 NOTE — Addendum Note (Signed)
Addended by: [place]ORDAN, Hadassah Rana B on: 04/14/2017 03:28 PM   Modules accepted: Orders

## 2017-04-14 NOTE — Progress Notes (Signed)
Vaginal pain/TDAP/blood consent today

## 2017-04-14 NOTE — Progress Notes (Signed)
Doing well. Getting TDAP today. [facility] baby [facility].

## 2017-04-20 ENCOUNTER — Other Ambulatory Visit: Payer: Self-pay | Admitting: Obstetrics & Gynecology

## 2017-04-20 MED ORDER — CONCEPT OB 130-92.4-1 MG PO CAPS: 1.0000 | ORAL_CAPSULE | Freq: Every day | ORAL | 11 refills | Status: DC

## 2017-04-28 ENCOUNTER — Ambulatory Visit (INDEPENDENT_AMBULATORY_CARE_PROVIDER_SITE_OTHER): Payer: 59 | Admitting: Obstetrics & Gynecology

## 2017-04-28 VITALS — BP 120/70 | Wt 253.0 lb

## 2017-04-28 DIAGNOSIS — O0993 Supervision of high risk pregnancy, unspecified, third trimester: Secondary | ICD-10-CM

## 2017-04-28 DIAGNOSIS — Z3A36 36 weeks gestation of pregnancy: Secondary | ICD-10-CM

## 2017-04-28 DIAGNOSIS — O99213 Obesity complicating pregnancy, third trimester: Secondary | ICD-10-CM

## 2017-04-28 DIAGNOSIS — O99013 Anemia complicating pregnancy, third trimester: Secondary | ICD-10-CM

## 2017-04-28 NOTE — Progress Notes (Signed)
Obesity RF discussed.  NST/AFI weekly, induce by 41 weeks.  Monitor FM and for labor.  PNV.  GBS and Aptima today.

## 2017-04-28 NOTE — Patient Instructions (Signed)
Group B Streptococcus Infection During Pregnancy Group B Streptococcus (GBS) is a type of bacteria (Streptococcus agalactiae) that is often found in healthy people, commonly in the rectum, vagina, and intestines. In people who are healthy and not pregnant, the bacteria rarely cause serious illness or complications. However, women who test positive for GBS during pregnancy can pass the bacteria to their baby during childbirth, which can cause serious infection in the baby after birth. Women with GBS may also have infections during their pregnancy or immediately after childbirth, such as such as urinary tract infections (UTIs) or infections of the uterus (uterine infections). Having GBS also increases a woman's risk of complications during pregnancy, such as early (preterm) labor or delivery, miscarriage, or stillbirth. Routine testing (screening) for GBS is recommended for all pregnant women. What increases the risk? You may have a higher risk for GBS infection during pregnancy if you had one during a past pregnancy. What are the signs or symptoms? In most cases, GBS infection does not cause symptoms in pregnant women. Signs and symptoms of a possible GBS-related infection may include:  Labor starting before the 37th week of pregnancy.  A UTI or bladder infection, which may cause: ? Fever. ? Pain or burning during urination. ? Frequent urination.  Fever during labor, along with: ? Bad-smelling discharge. ? Uterine tenderness. ? Rapid heartbeat in the mother, baby, or both.  Rare but serious symptoms of a possible GBS-related infection in women include:  Blood infection (septicemia). This may cause fever, chills, or confusion.  Lung infection (pneumonia). This may cause fever, chills, cough, rapid breathing, difficulty breathing, or chest pain.  Bone, joint, skin, or soft tissue infection.  How is this diagnosed? You may be screened for GBS between week 35 and week 37 of your pregnancy. If  you have symptoms of preterm labor, you may be screened earlier. This condition is diagnosed based on lab test results from:  A swab of fluid from the vagina and rectum.  A urine sample.  How is this treated? This condition is treated with antibiotic medicine. When you go into labor, or as soon as your water breaks (your membranes rupture), you will be given antibiotics through an IV tube. Antibiotics will continue until after you give birth. If you are having a cesarean delivery, you do not need antibiotics unless your membranes have already ruptured. Follow these instructions at home:  Take over-the-counter and prescription medicines only as told by your health care provider.  Take your antibiotic medicine as told by your health care provider. Do not stop taking the antibiotic even if you start to feel better.  Keep all pre-birth (prenatal) visits and follow-up visits as told by your health care provider. This is important. Contact a health care provider if:  You have pain or burning when you urinate.  You have to urinate frequently.  You have a fever or chills.  You develop a bad-smelling vaginal discharge. Get help right away if:  Your membranes rupture.  You go into labor.  You have severe pain in your abdomen.  You have difficulty breathing.  You have chest pain. This information is not intended to replace advice given to you by your health care provider. Make sure you discuss any questions you have with your health care provider. Document Released: 02/21/2008 Document Revised: 06/10/2016 Document Reviewed: 06/09/2016 Elsevier Interactive Patient Education  2018 Elsevier Inc.  

## 2017-05-01 ENCOUNTER — Telehealth: Payer: Self-pay

## 2017-05-01 LAB — GC/CHLAMYDIA PROBE AMP
CHLAMYDIA, DNA PROBE: NEGATIVE
Neisseria gonorrhoeae by PCR: NEGATIVE

## 2017-05-01 LAB — CULTURE, BETA STREP (GROUP B ONLY): Strep Gp B Culture: POSITIVE — AB

## 2017-05-01 NOTE — Telephone Encounter (Signed)
FMLA/DISABILITY form for [employer] filled out and given to [place] for processing.

## 2017-05-04 ENCOUNTER — Ambulatory Visit (INDEPENDENT_AMBULATORY_CARE_PROVIDER_SITE_OTHER): Payer: 59

## 2017-05-04 DIAGNOSIS — O99213 Obesity complicating pregnancy, third trimester: Secondary | ICD-10-CM

## 2017-05-05 ENCOUNTER — Ambulatory Visit (INDEPENDENT_AMBULATORY_CARE_PROVIDER_SITE_OTHER): Payer: 59 | Admitting: Advanced Practice Midwife

## 2017-05-05 VITALS — BP 110/60 | Wt 256.0 lb

## 2017-05-05 DIAGNOSIS — Z3A37 37 weeks gestation of pregnancy: Secondary | ICD-10-CM

## 2017-05-05 DIAGNOSIS — O0993 Supervision of high risk pregnancy, unspecified, third trimester: Secondary | ICD-10-CM

## 2017-05-05 NOTE — Progress Notes (Signed)
NST reactive today. Baseline 150 bpm, moderate variability, + accelerations, - decelerations. AFI is 10.87 Good fetal movement, no VB, Ctx's. Had a small amount of fluid leak out following intercourse last night.  Labor precautions reviewed.

## 2017-05-05 NOTE — Patient Instructions (Signed)
Vaginal Delivery Vaginal delivery means that you will give birth by pushing your baby out of your birth canal (vagina). A team of health care providers will help you before, during, and after vaginal delivery. Birth experiences are unique for every woman and every pregnancy, and birth experiences vary depending on where you choose to give birth. What should I do to prepare for my baby's birth? Before your baby is born, it is important to talk with your health care provider about:  Your labor and delivery preferences. These may include: ? Medicines that you may be given. ? How you will manage your pain. This might include non-medical pain relief techniques or injectable pain relief such as epidural analgesia. ? How you and your baby will be monitored during labor and delivery. ? Who may be in the labor and delivery room with you. ? Your feelings about surgical delivery of your baby (cesarean delivery, or C-section) if this becomes necessary. ? Your feelings about receiving donated blood through an IV tube (blood transfusion) if this becomes necessary.  Whether you are able: ? To take pictures or videos of the birth. ? To eat during labor and delivery. ? To move around, walk, or change positions during labor and delivery.  What to expect after your baby is born, such as: ? Whether delayed umbilical cord clamping and cutting is offered. ? Who will care for your baby right after birth. ? Medicines or tests that may be recommended for your baby. ? Whether breastfeeding is supported in your hospital or birth center. ? How long you will be in the hospital or birth center.  How any medical conditions you have may affect your baby or your labor and delivery experience.  To prepare for your baby's birth, you should also:  Attend all of your health care visits before delivery (prenatal visits) as recommended by your health care provider. This is important.  Prepare your home for your baby's  arrival. Make sure that you have: ? Diapers. ? Baby clothing. ? Feeding equipment. ? Safe sleeping arrangements for you and your baby.  Install a car seat in your vehicle. Have your car seat checked by a certified car seat installer to make sure that it is installed safely.  Think about who will help you with your new baby at home for at least the first several weeks after delivery.  What can I expect when I arrive at the birth center or hospital? Once you are in labor and have been admitted into the hospital or birth center, your health care provider may:  Review your pregnancy history and any concerns you have.  Insert an IV tube into one of your veins. This is used to give you fluids and medicines.  Check your blood pressure, pulse, temperature, and heart rate (vital signs).  Check whether your bag of water (amniotic sac) has broken (ruptured).  Talk with you about your birth plan and discuss pain control options.  Monitoring Your health care provider may monitor your contractions (uterine monitoring) and your baby's heart rate (fetal monitoring). You may need to be monitored:  Often, but not continuously (intermittently).  All the time or for long periods at a time (continuously). Continuous monitoring may be needed if: ? You are taking certain medicines, such as medicine to relieve pain or make your contractions stronger. ? You have pregnancy or labor complications.  Monitoring may be done by:  Placing a special stethoscope or a handheld monitoring device on your abdomen to   check your baby's heartbeat, and feeling your abdomen for contractions. This method of monitoring does not continuously record your baby's heartbeat or your contractions.  Placing monitors on your abdomen (external monitors) to record your baby's heartbeat and the frequency and length of contractions. You may not have to wear external monitors all the time.  Placing monitors inside of your uterus  (internal monitors) to record your baby's heartbeat and the frequency, length, and strength of your contractions. ? Your health care provider may use internal monitors if he or she needs more information about the strength of your contractions or your baby's heart rate. ? Internal monitors are put in place by passing a thin, flexible wire through your vagina and into your uterus. Depending on the type of monitor, it may remain in your uterus or on your baby's head until birth. ? Your health care provider will discuss the benefits and risks of internal monitoring with you and will ask for your permission before inserting the monitors.  Telemetry. This is a type of continuous monitoring that can be done with external or internal monitors. Instead of having to stay in bed, you are able to move around during telemetry. Ask your health care provider if telemetry is an option for you.  Physical exam Your health care provider may perform a physical exam. This may include:  Checking whether your baby is positioned: ? With the head toward your vagina (head-down). This is most common. ? With the head toward the top of your uterus (head-up or breech). If your baby is in a breech position, your health care provider may try to turn your baby to a head-down position so you can deliver vaginally. If it does not seem that your baby can be born vaginally, your provider may recommend surgery to deliver your baby. In rare cases, you may be able to deliver vaginally if your baby is head-up (breech delivery). ? Lying sideways (transverse). Babies that are lying sideways cannot be delivered vaginally.  Checking your cervix to determine: ? Whether it is thinning out (effacing). ? Whether it is opening up (dilating). ? How low your baby has moved into your birth canal.  What are the three stages of labor and delivery?  Normal labor and delivery is divided into the following three stages: Stage 1  Stage 1 is the  longest stage of labor, and it can last for hours or days. Stage 1 includes: ? Early labor. This is when contractions may be irregular, or regular and mild. Generally, early labor contractions are more than 10 minutes apart. ? Active labor. This is when contractions get longer, more regular, more frequent, and more intense. ? The transition phase. This is when contractions happen very close together, are very intense, and may last longer than during any other part of labor.  Contractions generally feel mild, infrequent, and irregular at first. They get stronger, more frequent (about every 2-3 minutes), and more regular as you progress from early labor through active labor and transition.  Many women progress through stage 1 naturally, but you may need help to continue making progress. If this happens, your health care provider may talk with you about: ? Rupturing your amniotic sac if it has not ruptured yet. ? Giving you medicine to help make your contractions stronger and more frequent.  Stage 1 ends when your cervix is completely dilated to 4 inches (10 cm) and completely effaced. This happens at the end of the transition phase. Stage 2  Once   your cervix is completely effaced and dilated to 4 inches (10 cm), you may start to feel an urge to push. It is common for the body to naturally take a rest before feeling the urge to push, especially if you received an epidural or certain other pain medicines. This rest period may last for up to 1-2 hours, depending on your unique labor experience.  During stage 2, contractions are generally less painful, because pushing helps relieve contraction pain. Instead of contraction pain, you may feel stretching and burning pain, especially when the widest part of your baby's head passes through the vaginal opening (crowning).  Your health care provider will closely monitor your pushing progress and your baby's progress through the vagina during stage 2.  Your  health care provider may massage the area of skin between your vaginal opening and anus (perineum) or apply warm compresses to your perineum. This helps it stretch as the baby's head starts to crown, which can help prevent perineal tearing. ? In some cases, an incision may be made in your perineum (episiotomy) to allow the baby to pass through the vaginal opening. An episiotomy helps to make the opening of the vagina larger to allow more room for the baby to fit through.  It is very important to breathe and focus so your health care provider can control the delivery of your baby's head. Your health care provider may have you decrease the intensity of your pushing, to help prevent perineal tearing.  After delivery of your baby's head, the shoulders and the rest of the body generally deliver very quickly and without difficulty.  Once your baby is delivered, the umbilical cord may be cut right away, or this may be delayed for 1-2 minutes, depending on your baby's health. This may vary among health care providers, hospitals, and birth centers.  If you and your baby are healthy enough, your baby may be placed on your chest or abdomen to help maintain the baby's temperature and to help you bond with each other. Some mothers and babies start breastfeeding at this time. Your health care team will dry your baby and help keep your baby warm during this time.  Your baby may need immediate care if he or she: ? Showed signs of distress during labor. ? Has a medical condition. ? Was born too early (prematurely). ? Had a bowel movement before birth (meconium). ? Shows signs of difficulty transitioning from being inside the uterus to being outside of the uterus. If you are planning to breastfeed, your health care team will help you begin a feeding. Stage 3  The third stage of labor starts immediately after the birth of your baby and ends after you deliver the placenta. The placenta is an organ that develops  during pregnancy to provide oxygen and nutrients to your baby in the womb.  Delivering the placenta may require some pushing, and you may have mild contractions. Breastfeeding can stimulate contractions to help you deliver the placenta.  After the placenta is delivered, your uterus should tighten (contract) and become firm. This helps to stop bleeding in your uterus. To help your uterus contract and to control bleeding, your health care provider may: ? Give you medicine by injection, through an IV tube, by mouth, or through your rectum (rectally). ? Massage your abdomen or perform a vaginal exam to remove any blood clots that are left in your uterus. ? Empty your bladder by placing a thin, flexible tube (catheter) into your bladder. ? Encourage   you to breastfeed your baby. After labor is over, you and your baby will be monitored closely to ensure that you are both healthy until you are ready to go home. Your health care team will teach you how to care for yourself and your baby. This information is not intended to replace advice given to you by your health care provider. Make sure you discuss any questions you have with your health care provider. Document Released: 08/23/2008 Document Revised: 06/03/2016 Document Reviewed: 11/29/2015 Elsevier Interactive Patient Education  2018 Elsevier Inc.  

## 2017-05-11 ENCOUNTER — Encounter: Payer: Self-pay | Admitting: *Deleted

## 2017-05-11 ENCOUNTER — Inpatient Hospital Stay
Admission: EM | Admit: 2017-05-11 | Discharge: 2017-05-14 | DRG: 775 | Disposition: A | Discharge status: Home / Self Care | Payer: Commercial Managed Care - HMO | Attending: Obstetrics and Gynecology | Admitting: Obstetrics and Gynecology

## 2017-05-11 ENCOUNTER — Ambulatory Visit (INDEPENDENT_AMBULATORY_CARE_PROVIDER_SITE_OTHER): Payer: 59

## 2017-05-11 DIAGNOSIS — Z3A38 38 weeks gestation of pregnancy: Secondary | ICD-10-CM

## 2017-05-11 DIAGNOSIS — Z87891 Personal history of nicotine dependence: Secondary | ICD-10-CM

## 2017-05-11 DIAGNOSIS — O99824 Streptococcus B carrier state complicating childbirth: Secondary | ICD-10-CM | POA: Diagnosis present

## 2017-05-11 DIAGNOSIS — E669 Obesity, unspecified: Secondary | ICD-10-CM | POA: Diagnosis present

## 2017-05-11 DIAGNOSIS — Z7982 Long term (current) use of aspirin: Secondary | ICD-10-CM

## 2017-05-11 DIAGNOSIS — Z362 Encounter for other antenatal screening follow-up: Secondary | ICD-10-CM | POA: Diagnosis not present

## 2017-05-11 DIAGNOSIS — O99214 Obesity complicating childbirth: Secondary | ICD-10-CM | POA: Diagnosis present

## 2017-05-11 DIAGNOSIS — O4103X0 Oligohydramnios, third trimester, not applicable or unspecified: Principal | ICD-10-CM | POA: Diagnosis present

## 2017-05-11 DIAGNOSIS — Z6841 Body Mass Index (BMI) 40.0 and over, adult: Secondary | ICD-10-CM | POA: Diagnosis not present

## 2017-05-11 DIAGNOSIS — O0993 Supervision of high risk pregnancy, unspecified, third trimester: Secondary | ICD-10-CM

## 2017-05-11 LAB — CHLAMYDIA/NGC RT PCR (ARMC ONLY)
Chlamydia Tr: NOT DETECTED
N gonorrhoeae: NOT DETECTED

## 2017-05-11 LAB — CBC
HCT: 32.4 % — ABNORMAL LOW (ref 35.0–47.0)
Hemoglobin: 10.9 g/dL — ABNORMAL LOW (ref 12.0–16.0)
MCH: 27.2 pg (ref 26.0–34.0)
MCHC: 33.5 g/dL (ref 32.0–36.0)
MCV: 81 fL (ref 80.0–100.0)
PLATELETS: 273 10*3/uL (ref 150–440)
RBC: 4 MIL/uL (ref 3.80–5.20)
RDW: 15.7 % — AB (ref 11.5–14.5)
WBC: 8.5 10*3/uL (ref 3.6–11.0)

## 2017-05-11 LAB — TYPE AND SCREEN
ABO/RH(D): B POS
Antibody Screen: NEGATIVE

## 2017-05-11 MED ORDER — LIDOCAINE HCL (PF) 1 % IJ SOLN
30.0000 mL | INTRAMUSCULAR | Status: DC | PRN
  Filled 2017-05-11: qty 30

## 2017-05-11 MED ORDER — AMPICILLIN SODIUM 1 G IJ SOLR
INTRAMUSCULAR | Status: AC
  Filled 2017-05-11: qty 1000

## 2017-05-11 MED ORDER — BUTORPHANOL TARTRATE 1 MG/ML IJ SOLN
1.0000 mg | INTRAMUSCULAR | Status: DC | PRN
  Administered 2017-05-12 (×2): 1 mg via INTRAVENOUS
  Filled 2017-05-11 (×2): qty 2

## 2017-05-11 MED ORDER — MISOPROSTOL 25 MCG QUARTER TABLET
25.0000 ug | ORAL_TABLET | ORAL | Status: DC | PRN
  Administered 2017-05-11 – 2017-05-12 (×4): 25 ug via ORAL
  Filled 2017-05-11 (×3): qty 1

## 2017-05-11 MED ORDER — OXYTOCIN 40 UNITS IN LACTATED RINGERS INFUSION - SIMPLE MED
2.5000 [IU]/h | INTRAVENOUS | Status: DC
  Filled 2017-05-11: qty 1000

## 2017-05-11 MED ORDER — SOD CITRATE-CITRIC ACID 500-334 MG/5ML PO SOLN
30.0000 mL | ORAL | Status: DC | PRN
  Filled 2017-05-11: qty 30

## 2017-05-11 MED ORDER — PENICILLIN G POT IN DEXTROSE 60000 UNIT/ML IV SOLN
3.0000 10*6.[IU] | INTRAVENOUS | Status: DC
  Administered 2017-05-11 – 2017-05-12 (×6): 3 10*6.[IU] via INTRAVENOUS
  Filled 2017-05-11 (×13): qty 50

## 2017-05-11 MED ORDER — ONDANSETRON HCL 4 MG/2ML IJ SOLN: 4.0000 mg | Freq: Four times a day (QID) | INTRAMUSCULAR | Status: DC | PRN

## 2017-05-11 MED ORDER — LACTATED RINGERS IV SOLN
INTRAVENOUS | Status: DC
  Administered 2017-05-11: 1000 mL via INTRAVENOUS
  Administered 2017-05-12 (×3): via INTRAVENOUS

## 2017-05-11 MED ORDER — TERBUTALINE SULFATE 1 MG/ML IJ SOLN: 0.2500 mg | Freq: Once | INTRAMUSCULAR | Status: DC | PRN

## 2017-05-11 MED ORDER — OXYTOCIN BOLUS FROM INFUSION
500.0000 mL | Freq: Once | INTRAVENOUS | Status: AC
  Administered 2017-05-12: 500 mL via INTRAVENOUS

## 2017-05-11 MED ORDER — ACETAMINOPHEN 325 MG PO TABS: 650.0000 mg | ORAL_TABLET | ORAL | Status: DC | PRN

## 2017-05-11 MED ORDER — LACTATED RINGERS IV SOLN: 500.0000 mL | INTRAVENOUS | Status: DC | PRN

## 2017-05-11 MED ORDER — MISOPROSTOL 25 MCG QUARTER TABLET
25.0000 ug | ORAL_TABLET | ORAL | Status: DC | PRN
Start: 2017-05-11 — End: 2017-05-11
  Filled 2017-05-11: qty 1

## 2017-05-11 MED ORDER — PENICILLIN G POTASSIUM 5000000 UNITS IJ SOLR
5.0000 10*6.[IU] | Freq: Once | INTRAVENOUS | Status: AC
  Administered 2017-05-11: 5 10*6.[IU] via INTRAVENOUS
  Filled 2017-05-11: qty 5

## 2017-05-11 NOTE — H&P (Signed)
Obstetric H&P   Chief Complaint: Oligohydramnios  Prenatal Care Provider: [facility]  History of Present Illness: 26 y.o. G2P1001 8715w4d by 05/21/2017, by Ultrasound presenting to L&D for AFI obtained today in clinic of 2.2cm, prior 10.78 on 05/04/17.  Denies LOF.  +FM, no ctx, no VB.    Pregnancy complicated by obesity.  Pelvis tested to 7lbs 8oz.  Initial weight 237lbs with 20lbs weight gain this pregnancy.  Last growth scan at 32 week did not obtain 36 week growth scan showing EFW of  2106g 4lbs 10oz c/w 55.8%ile.  Clinic [facility] Prenatal [facility]  Dating 7 week US [facility] Blood type: --/--/B POS (11/07 1721)   Genetic Screen 1 Screen: negative AFP negative Antibody: negative  Anatomic US 01/02/17 complete Rubella: Non-Immune Varicella: Immune  GTT Early: 115 Third trimester:  RPR: Non Reactive (04/02 1515)   Rhogam N/A HBsAg:   Negative   TDaP vaccine 04/14/17       HIV: Non Reactive (04/02 1515)   Anemia Hgb 9.8  GBS: Positive 04/28/17  Obesity NST/AFI weekly 36, growth US, ASA Pap: None on record needs postpartum  Hemoglobin AA       Review of Systems: 10 point review of systems negative unless otherwise noted in HPI  Past Medical History: History reviewed. No pertinent past medical history.  Past Surgical History: Past Surgical History:  Procedure Laterality Date  . PERIPHERAL VASCULAR THROMBECTOMY  2013   removed from left hand   Family History: History reviewed. No pertinent family history.  Social History: Social History   Social History  . Marital status: Single    Spouse name: N/A  . Number of children: N/A  . Years of education: N/A   Occupational History  . Not on file.   Social History Main Topics  . Smoking status: Former Games developermoker  . Smokeless tobacco: Never Used  . Alcohol use Yes  . Drug use: No  . Sexual activity: Yes    Birth control/ protection: None   Other Topics Concern  . Not on file   Social History Narrative  . No narrative on file     Medications: Prior to Admission medications   Medication Sig Start Date End Date Taking? Authorizing Provider  aspirin 81 MG EC tablet Take 81 mg by mouth every evening. 02/09/17  Yes [provider]  ferrous sulfate 325 (65 FE) MG tablet Take 1 tablet (325 mg total) by mouth daily with breakfast. 03/08/17  Yes Farrel ConnersGutierrez, Colleen, CNM  Prenat w/o A Vit-FeFum-FePo-FA (CONCEPT OB) 130-92.4-1 MG CAPS Take 1 capsule by mouth daily. 04/20/17  Yes Nadara MustardHarris, Robert P, MD    Allergies: No Known Allergies  Physical Exam: Vitals: Blood pressure 123/60, pulse 74, temperature 98.4 F (36.9 C), temperature source Oral, resp. rate 18, height 5\' 4"  (1.626 m), weight 256 lb (116.1 kg), last menstrual period 06/17/2016.  Urine Dip Protein: N/A  FHT: 145, moderate, +accels, no decels Toco: none  General: NAD HEENT: normocephalic, anicteric Pulmonary: No increased work of breathing Cardiovascular: RRR, distal pulses 2+ Abdomen: Gravid, non-tender Leopolds: vtx Genitourinary: FTP/50/-3 Extremities: no edema, erythema, or tenderness Neurologic: Grossly intact Psychiatric: mood appropriate, affect full  Labs: No results found for this or any previous visit (from the past 24 hour(s)).  Assessment: 26 y.o. G2P1001 2415w4d by 05/21/2017, by Ultrasound presenting to L&D with oligohydramnios at term  Plan: 1) Oligohydramnios - proceed with cytotec IOL  2) Fetus - category I tracing  3) PNL - see above  4) TDAP -  04/14/2017  5) Disposition - pending delivery

## 2017-05-11 NOTE — OB Triage Note (Signed)
Presents with low AFI.

## 2017-05-12 ENCOUNTER — Inpatient Hospital Stay: Payer: Commercial Managed Care - HMO | Admitting: Anesthesiology

## 2017-05-12 ENCOUNTER — Encounter: Payer: 59 | Admitting: Obstetrics & Gynecology

## 2017-05-12 DIAGNOSIS — O4103X0 Oligohydramnios, third trimester, not applicable or unspecified: Secondary | ICD-10-CM

## 2017-05-12 DIAGNOSIS — Z3A38 38 weeks gestation of pregnancy: Secondary | ICD-10-CM

## 2017-05-12 LAB — RPR: RPR: NONREACTIVE

## 2017-05-12 MED ORDER — OXYTOCIN 40 UNITS IN LACTATED RINGERS INFUSION - SIMPLE MED
1.0000 m[IU]/min | INTRAVENOUS | Status: DC
  Administered 2017-05-12: 1 m[IU]/min via INTRAVENOUS

## 2017-05-12 MED ORDER — TERBUTALINE SULFATE 1 MG/ML IJ SOLN: 0.2500 mg | Freq: Once | INTRAMUSCULAR | Status: DC | PRN

## 2017-05-12 MED ORDER — STERILE WATER FOR INJECTION IJ SOLN
INTRAMUSCULAR | Status: AC
  Filled 2017-05-12: qty 50

## 2017-05-12 MED ORDER — FENTANYL 2.5 MCG/ML W/ROPIVACAINE 0.2% IN NS 100 ML EPIDURAL INFUSION (ARMC-ANES)
EPIDURAL | Status: AC
  Filled 2017-05-12: qty 100

## 2017-05-12 MED ORDER — LIDOCAINE-EPINEPHRINE (PF) 1.5 %-1:200000 IJ SOLN
INTRAMUSCULAR | Status: DC | PRN
  Administered 2017-05-12: 3 mL

## 2017-05-12 MED ORDER — SODIUM CHLORIDE 0.9 % IV SOLN
INTRAVENOUS | Status: DC | PRN
  Administered 2017-05-12 (×3): 5 mL via EPIDURAL

## 2017-05-12 MED ORDER — FENTANYL 2.5 MCG/ML W/ROPIVACAINE 0.15% IN NS 100 ML EPIDURAL (ARMC)
EPIDURAL | Status: DC | PRN
  Administered 2017-05-12: 12 mL/h via EPIDURAL

## 2017-05-12 NOTE — Progress Notes (Signed)
Recvd per report.  Resting with eyes closed.

## 2017-05-12 NOTE — Anesthesia Procedure Notes (Signed)
Epidural Patient location during procedure: OB Start time: 05/12/2017 8:15 PM End time: 05/12/2017 8:43 PM  Staffing Anesthesiologist: Alver FisherPENWARDEN, Cullen Vanallen Performed: anesthesiologist   Preanesthetic Checklist Completed: patient identified, site marked, surgical consent, pre-op evaluation, timeout performed, IV checked, risks and benefits discussed and monitors and equipment checked  Epidural Patient position: sitting Prep: ChloraPrep Patient monitoring: heart rate, continuous pulse ox and blood pressure Approach: midline Location: L2-L3 Injection technique: LOR saline  Needle:  Needle type: Tuohy  Needle gauge: 18 G Needle length: 9 cm and 9 Needle insertion depth: 9 cm Catheter type: closed end flexible Catheter size: 20 Guage Catheter at skin depth: 14 cm Test dose: negative (0.125% bupivacaine)  Assessment Events: blood not aspirated, injection not painful, no injection resistance, negative IV test and no paresthesia  Additional Notes   Patient tolerated the insertion well without complications.Reason for block:procedure for pain

## 2017-05-12 NOTE — Progress Notes (Signed)
Patient ID: Peggy Shaw, female   DOB: 07/10/1991, 26 y.o.   MRN: [id number] Labor Check  Subj:  Complaints: occasional ctx   Obj:  BP (!) 99/54   Pulse 73   Temp 98.6 F (37 C) (Oral)   Resp (!) 22   Ht 5\' 4"  (1.626 m)   Wt 256 lb (116.1 kg)   LMP 06/17/2016 Comment: irregular periods  BMI 43.94 kg/m     Cervix: Dilation: 3 / Effacement (%): 50 / Station: -3  Baseline FHR: 140 beats/min   Variability: moderate   Accelerations: present   Decelerations: absent Contractions: present frequency: 2 q 10 min Overall assessment: cat 1  A/P: 26 y.o. G2P1001 female at 7952w5d with IOL.  1.  Labor: start pitocin  2.  FWB: reassuring, Overall assessment: category 1  3.  GBS positive - PCN  4.  Pain: prn, epidural when ready 5.  Recheck: 2 hours prn   Thomasene MohairStephen Jehad Bisono, MD 05/12/2017 2:26 PM

## 2017-05-12 NOTE — Progress Notes (Signed)
Patient ID: Peggy Shaw, female   DOB: 05/30/1991, 26 y.o.   MRN: [id number]  Labor Check  Subj:  Complaints: none. Feeling mild ctx with misoprostol.    Obj:  BP (!) 98/50   Pulse 71   Temp 98.6 F (37 C) (Oral)   Resp (!) 22   Ht 5\' 4"  (1.626 m)   Wt 256 lb (116.1 kg)   LMP 06/17/2016 Comment: irregular periods  BMI 43.94 kg/m     Cervix: Dilation: 1 / Effacement (%): 50 / Station: -3  Baseline FHR: 135 beats/min   Variability: moderate   Accelerations: present   Decelerations: absent Contractions: present frequency: infrequent Overall assessment: cat 1  A/P: 26 y.o. G2P1001 female at 815w5d with IOL for oligohydramnios.  1.  Labor: one more dose of misoprostol. Discussed ongoing management and options for advancing the process, including; misoprostol, mechanical dilation with a foley bulb, and pitocin.  2.  FWB: reassugin, Overall assessment: category 1  3.  GBS pos - PCN per CDC protocol  4.  Pain: prn 5.  Recheck: 4 hours after miso placement.   Thomasene MohairStephen Tameika Heckmann, MD 05/12/2017 8:55 AM

## 2017-05-12 NOTE — Anesthesia Preprocedure Evaluation (Signed)
Anesthesia Evaluation  Patient identified by MRN, date of birth, ID band Patient awake    Reviewed: Allergy & Precautions, NPO status , Patient's Chart, lab work & pertinent test results  History of Anesthesia Complications Negative for: history of anesthetic complications  Airway Mallampati: III  TM Distance: >3 FB Neck ROM: Full    Dental no notable dental hx.    Pulmonary neg sleep apnea, neg COPD, former smoker,    breath sounds clear to auscultation- rhonchi (-) wheezing      Cardiovascular Exercise Tolerance: Good (-) hypertension(-) CAD and (-) Past MI  Rhythm:Regular Rate:Normal - Systolic murmurs and - Diastolic murmurs    Neuro/Psych negative neurological ROS  negative psych ROS   GI/Hepatic negative GI ROS, Neg liver ROS,   Endo/Other  negative endocrine ROSneg diabetesMorbid obesity  Renal/GU negative Renal ROS     Musculoskeletal negative musculoskeletal ROS (+)   Abdominal (+) + obese, Gravid abdomen  Peds  Hematology  (+) anemia ,   Anesthesia Other Findings   Reproductive/Obstetrics (+) Pregnancy                             Anesthesia Physical Anesthesia Plan  ASA: II  Anesthesia Plan: Epidural   Post-op Pain Management:    Induction:   PONV Risk Score and Plan: 2  Airway Management Planned:   Additional Equipment:   Intra-op Plan:   Post-operative Plan:   Informed Consent: I have reviewed the patients History and Physical, chart, labs and discussed the procedure including the risks, benefits and alternatives for the proposed anesthesia with the patient or authorized representative who has indicated his/her understanding and acceptance.     Plan Discussed with: Anesthesiologist  Anesthesia Plan Comments: (Plan for epidural for labor, discussed epidural vs spinal vs GA if need for csection)        Lab Results  Component Value Date   WBC 8.5  05/11/2017   HGB 10.9 (L) 05/11/2017   HCT 32.4 (L) 05/11/2017   MCV 81.0 05/11/2017   PLT 273 05/11/2017    Anesthesia Quick Evaluation

## 2017-05-12 NOTE — Discharge Summary (Signed)
OB Discharge Summary     Patient Name: Peggy Shaw DOB: 06/02/1991 MRN: [id number]  Date of admission: 05/11/2017 Delivering MD: Thomasene MohairStephen Jackson, MD  Date of Delivery: 05/12/2017  Date of discharge: 05/14/2017  Admitting diagnosis: fetal monitoring vaginal pain Intrauterine pregnancy: 7850w5d     Secondary diagnosis: None     Discharge diagnosis: Term Pregnancy Delivered                                                                                                Post partum procedures:none  Augmentation: Pitocin and Cytotec  Complications: None  Hospital course:  Induction of Labor With Vaginal Delivery   26 y.o. yo G2P1001 at 5050w5d was admitted to the hospital 05/11/2017 for induction of labor.  Indication for induction: oligohydramnios.  Patient had an uncomplicated labor course as follows: Membrane Rupture Time/Date:   ,    Intrapartum Procedures: Episiotomy: None [1]                                         Lacerations:  None [1]  Patient had delivery of a Viable infant.  Information for the patient's newborn:  Haze RushingOliver, Girl Peggy [[id number]][[id number]]  Delivery Method: Vag-Spont   05/12/2017  Details of delivery can be found in separate delivery note.  Patient had a routine postpartum course. Patient is discharged home 05/14/17.  Physical exam  Vitals:   05/13/17 1600 05/13/17 1614 05/13/17 2000 05/14/17 0751  BP:  (!) 101/50 (!) 100/49 (!) 90/46  Pulse: 68  68 (!) 57  Resp: 18  18 17   Temp: 98.7 F (37.1 C)  98.9 F (37.2 C) 97.6 F (36.4 C)  TempSrc: Oral  Oral Oral  SpO2: 100%   100%  Weight:      Height:       General: alert and cooperative Lochia: appropriate Uterine Fundus: firm Incision: N/A DVT Evaluation: No evidence of DVT seen on physical exam. Negative Homan's sign.  Labs: Lab Results  Component Value Date   WBC 11.0 05/13/2017   HGB 10.2 (L) 05/13/2017   HCT 30.6 (L) 05/13/2017   MCV 80.6 05/13/2017   PLT 248 05/13/2017    Discharge  instruction: per After Visit Summary.  Medications:  Allergies as of 05/14/2017   No Known Allergies     Medication List    TAKE these medications   aspirin 81 MG EC tablet Take 81 mg by mouth every evening.   CONCEPT OB 130-92.4-1 MG Caps Take 1 capsule by mouth daily.   ferrous sulfate 325 (65 FE) MG tablet Take 1 tablet (325 mg total) by mouth daily with breakfast.       Diet: routine diet  Activity: Advance as tolerated. Pelvic rest for 6 weeks.   Outpatient follow up: Follow-up Information    Conard NovakJackson, Stephen D, MD Follow up in 6 week(s).   Specialty:  Obstetrics and Gynecology Why:  6 week postpartum visit Contact information: [address] [place] [address] [phone]  Postpartum contraception: Undecided Rhogam Given postpartum: no Rubella vaccine given postpartum: yes Varicella vaccine given postpartum: no TDaP given antepartum or postpartum: AP 04/14/17  Newborn Data: Live born female  APGAR: 8, 38   Baby Feeding: Bottle  Disposition:home with mother  SIGNED: Annamarie Major, MD, [facility], [facility] 05/14/2017  9:53 AM

## 2017-05-13 LAB — CBC
HEMATOCRIT: 30.6 % — AB (ref 35.0–47.0)
HEMOGLOBIN: 10.2 g/dL — AB (ref 12.0–16.0)
MCH: 26.8 pg (ref 26.0–34.0)
MCHC: 33.3 g/dL (ref 32.0–36.0)
MCV: 80.6 fL (ref 80.0–100.0)
Platelets: 248 10*3/uL (ref 150–440)
RBC: 3.8 MIL/uL (ref 3.80–5.20)
RDW: 15.7 % — AB (ref 11.5–14.5)
WBC: 11 10*3/uL (ref 3.6–11.0)

## 2017-05-13 MED ORDER — SENNOSIDES-DOCUSATE SODIUM 8.6-50 MG PO TABS
2.0000 | ORAL_TABLET | ORAL | Status: DC
  Administered 2017-05-13: 2 via ORAL
  Filled 2017-05-13: qty 2

## 2017-05-13 MED ORDER — ACETAMINOPHEN 325 MG PO TABS: 650.0000 mg | ORAL_TABLET | ORAL | Status: DC | PRN

## 2017-05-13 MED ORDER — ONDANSETRON HCL 4 MG PO TABS: 4.0000 mg | ORAL_TABLET | ORAL | Status: DC | PRN

## 2017-05-13 MED ORDER — PRENATAL MULTIVITAMIN CH
1.0000 | ORAL_TABLET | Freq: Every day | ORAL | Status: DC
  Administered 2017-05-13 – 2017-05-14 (×2): 1 via ORAL
  Filled 2017-05-13 (×2): qty 1

## 2017-05-13 MED ORDER — MEASLES, MUMPS & RUBELLA VAC ~~LOC~~ INJ: 0.5000 mL | INJECTION | SUBCUTANEOUS | Status: DC | PRN

## 2017-05-13 MED ORDER — ONDANSETRON HCL 4 MG/2ML IJ SOLN: 4.0000 mg | INTRAMUSCULAR | Status: DC | PRN

## 2017-05-13 MED ORDER — SIMETHICONE 80 MG PO CHEW: 80.0000 mg | CHEWABLE_TABLET | ORAL | Status: DC | PRN

## 2017-05-13 MED ORDER — LACTATED RINGERS IV SOLN
500.0000 mL | Freq: Once | INTRAVENOUS | Status: AC
  Administered 2017-05-13: 500 mL via INTRAVENOUS

## 2017-05-13 MED ORDER — FERROUS SULFATE 325 (65 FE) MG PO TABS
325.0000 mg | ORAL_TABLET | Freq: Two times a day (BID) | ORAL | Status: DC
  Administered 2017-05-13 – 2017-05-14 (×3): 325 mg via ORAL
  Filled 2017-05-13 (×4): qty 1

## 2017-05-13 MED ORDER — DIBUCAINE 1 % RE OINT: 1.0000 "application " | TOPICAL_OINTMENT | RECTAL | Status: DC | PRN

## 2017-05-13 MED ORDER — PHENYLEPHRINE 40 MCG/ML (10ML) SYRINGE FOR IV PUSH (FOR BLOOD PRESSURE SUPPORT)
80.0000 ug | PREFILLED_SYRINGE | INTRAVENOUS | Status: DC | PRN
  Filled 2017-05-13: qty 5

## 2017-05-13 MED ORDER — DIPHENHYDRAMINE HCL 25 MG PO CAPS: 25.0000 mg | ORAL_CAPSULE | Freq: Four times a day (QID) | ORAL | Status: DC | PRN

## 2017-05-13 MED ORDER — COCONUT OIL OIL: 1.0000 "application " | TOPICAL_OIL | Status: DC | PRN

## 2017-05-13 MED ORDER — EPHEDRINE 5 MG/ML INJ
10.0000 mg | INTRAVENOUS | Status: DC | PRN
  Filled 2017-05-13: qty 2

## 2017-05-13 MED ORDER — BENZOCAINE-MENTHOL 20-0.5 % EX AERO: 1.0000 "application " | INHALATION_SPRAY | CUTANEOUS | Status: DC | PRN

## 2017-05-13 MED ORDER — WITCH HAZEL-GLYCERIN EX PADS: 1.0000 "application " | MEDICATED_PAD | CUTANEOUS | Status: DC | PRN

## 2017-05-13 MED ORDER — HYDROCODONE-ACETAMINOPHEN 5-325 MG PO TABS
1.0000 | ORAL_TABLET | Freq: Four times a day (QID) | ORAL | Status: DC | PRN
  Administered 2017-05-13 – 2017-05-14 (×3): 1 via ORAL
  Filled 2017-05-13 (×3): qty 1

## 2017-05-13 MED ORDER — DIPHENHYDRAMINE HCL 50 MG/ML IJ SOLN: 12.5000 mg | INTRAMUSCULAR | Status: DC | PRN

## 2017-05-13 MED ORDER — IBUPROFEN 600 MG PO TABS
600.0000 mg | ORAL_TABLET | Freq: Four times a day (QID) | ORAL | Status: DC
  Administered 2017-05-13 – 2017-05-14 (×6): 600 mg via ORAL
  Filled 2017-05-13 (×6): qty 1

## 2017-05-13 MED ORDER — FENTANYL 2.5 MCG/ML W/ROPIVACAINE 0.15% IN NS 100 ML EPIDURAL (ARMC): 12.0000 mL/h | EPIDURAL | Status: DC

## 2017-05-13 NOTE — Anesthesia Postprocedure Evaluation (Signed)
Anesthesia Post Note  Patient: Peggy Shaw  Procedure(s) Performed: * No procedures listed *  Patient location during evaluation: Mother Baby Anesthesia Type: Epidural Level of consciousness: awake and alert Pain management: pain level controlled Vital Signs Assessment: post-procedure vital signs reviewed and stable Respiratory status: spontaneous breathing, nonlabored ventilation and respiratory function stable Cardiovascular status: stable Postop Assessment: no headache, no backache and epidural receding Anesthetic complications: no     Last Vitals:  Vitals:   05/13/17 0715 05/13/17 1210  BP: (!) 106/47 (!) 103/45  Pulse: 69 70  Resp: 17 18  Temp: 36.9 C 37.1 C    Last Pain:  Vitals:   05/13/17 1210  TempSrc: Oral  PainSc:                  Konnor Vondrasek K

## 2017-05-13 NOTE — Progress Notes (Signed)
Admit Date: 05/11/2017 Today's Date: 05/13/2017  Post Partum Day 1  Subjective:  no complaints  Objective: Temp:  [98.3 F (36.8 C)-99 F (37.2 C)] 98.4 F (36.9 C) (06/16 0715) Pulse Rate:  [66-94] 69 (06/16 0715) Resp:  [17-18] 17 (06/16 0715) BP: (92-139)/(44-70) 106/47 (06/16 0715) SpO2:  [99 %-100 %] 99 % (06/16 0715)  Physical Exam:  General: alert and cooperative Lochia: appropriate Uterine Fundus: firm Incision: none DVT Evaluation: No evidence of DVT seen on physical exam. Negative Homan's sign.   Recent Labs  05/11/17 1623 05/13/17 0530  HGB 10.9* 10.2*  HCT 32.4* 30.6*    Assessment/Plan: Plan for discharge tomorrow, Contraception (unsure), Bottle Feeding and Infant doing well   LOS: 2 days   [facility] Shaw [facility] 05/13/2017, 9:35 AM

## 2017-05-14 NOTE — Progress Notes (Signed)
Admit Date: 05/11/2017 Today's Date: 05/14/2017  Post Partum Day 2  Subjective:  no complaints  Objective: Temp:  [97.6 F (36.4 C)-98.9 F (37.2 C)] 97.6 F (36.4 C) (06/17 0751) Pulse Rate:  [57-70] 57 (06/17 0751) Resp:  [17-18] 17 (06/17 0751) BP: (90-103)/(45-50) 90/46 (06/17 0751) SpO2:  [99 %-100 %] 100 % (06/17 0751)  Physical Exam:  General: alert and cooperative Lochia: appropriate Uterine Fundus: firm Incision: none DVT Evaluation: No evidence of DVT seen on physical exam. Negative Homan's sign.   Recent Labs  05/11/17 1623 05/13/17 0530  HGB 10.9* 10.2*  HCT 32.4* 30.6*    Assessment/Plan: Discharge home, Contraception (to discuss at 6 week PP visit, unsire), Bottle Feeding and Infant doing well   LOS: 3 days   Peggy Shaw [facility] 05/14/2017, 9:50 AM

## 2017-05-14 NOTE — Progress Notes (Signed)
Discharge instructions given. Patient verbalizes understanding of teaching. Patient discharged home at 1345. 

## 2017-06-27 ENCOUNTER — Ambulatory Visit (INDEPENDENT_AMBULATORY_CARE_PROVIDER_SITE_OTHER): Payer: 59 | Admitting: Obstetrics and Gynecology

## 2017-06-27 ENCOUNTER — Encounter: Payer: Self-pay | Admitting: Obstetrics and Gynecology

## 2017-06-27 NOTE — Progress Notes (Signed)
Postpartum Visit   Chief Complaint  Patient presents with  . 6 week post partum visit    History of Present Illness: Patient is a 26 y.o. [id number] presents for postpartum visit.  Date of delivery: 05/12/2017 Type of delivery: Vaginal delivery - Vacuum or forceps assisted  no Episiotomy No.  Laceration: no Pregnancy or labor problems:  Oligohydramnios requiring induction of labor Any problems since the delivery:  Dizziness and numbness on left side. Happens once a day for about 30 minutes.  Still has mobility without weakness.  No visual and facial symptoms.  No history of this.   Newborn Details:  SINGLETON :  1. Baby's name: Einar PheasantJa'kera. Birth weight: 7.2lb Maternal Details:  Breast Feeding:  No/bottle Post partum depression/anxiety noted:  no Edinburgh Post-Partum Depression Score:  5  Date of last PAP: reportedly 05/2016 at outside facility  History reviewed. No pertinent past medical history.  Past Surgical History:  Procedure Laterality Date  . 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 Authorizing Provider  aspirin 81 MG EC tablet Take 81 mg by mouth every evening. 02/09/17   [provider]  ferrous sulfate 325 (65 FE) MG tablet Take 1 tablet (325 mg total) by mouth daily with breakfast. Patient not taking: Reported on 06/27/2017 03/08/17   Farrel ConnersGutierrez, Colleen, CNM  Prenat w/o A Vit-FeFum-FePo-FA (CONCEPT OB) 130-92.4-1 MG CAPS Take 1 capsule by mouth daily. 04/20/17   Nadara MustardHarris, Robert P, MD    No Known Allergies   Social History   Social History  . Marital status: Single    Spouse name: N/A  . Number of children: N/A  . Years of education: N/A   Occupational History  . Not on file.   Social History Main Topics  . Smoking status: Former Games developermoker  . Smokeless tobacco: Never Used  . Alcohol use Yes  . Drug use: No  . Sexual activity: Yes    Birth control/  protection: None   Other Topics Concern  . Not on file   Social History Narrative  . No narrative on file   Family History: Denies history of gynecologic cancer  Review of Systems  Constitutional: Negative.   HENT: Negative.   Eyes: Negative.   Respiratory: Negative.   Cardiovascular: Negative.   Gastrointestinal: Negative.   Genitourinary: Negative.   Musculoskeletal: Negative.   Skin: Negative.   Neurological: Positive for dizziness and tingling. Negative for tremors, sensory change, speech change, focal weakness, seizures, loss of consciousness, weakness and headaches.  Psychiatric/Behavioral: Negative.      Physical Exam BP 118/78   Ht 5\' 6"  (1.676 m)   Wt 240 lb (108.9 kg)   LMP 06/26/2017   BMI 38.74 kg/m   Physical Exam  Constitutional: She is oriented to person, place, and time. She appears well-developed and well-nourished. No distress.  Genitourinary: Uterus normal. Pelvic exam was performed with patient supine. There is no rash, tenderness, lesion or injury on the right labia. There is no rash, tenderness, lesion or injury on the left labia. No tenderness or bleeding in the vagina. No signs of injury around the vagina. No vaginal discharge found. Right adnexum does not display mass, does not display tenderness and does not display fullness. Left adnexum does not display mass, does not display tenderness and does not display fullness. Cervix does not exhibit motion tenderness or polyp.   Uterus is mobile and  anteverted. Uterus is not enlarged, tender, exhibiting a mass or irregular (is regular).  Genitourinary Comments: Dark blood from cervix, menses like  HENT:  Head: Normocephalic and atraumatic.  Eyes: EOM are normal. No scleral icterus.  Neck: Normal range of motion. Neck supple. No thyromegaly present.  Cardiovascular: Normal rate, regular rhythm and normal heart sounds.   Pulmonary/Chest: Effort normal and breath sounds normal. No respiratory distress. She  has no wheezes. She has no rales.  Abdominal: Soft. Bowel sounds are normal. She exhibits no distension and no mass. There is no tenderness. There is no rebound and no guarding.  Musculoskeletal: Normal range of motion. She exhibits no edema.  Lymphadenopathy:    She has no cervical adenopathy.  Neurological: She is alert and oriented to person, place, and time. No cranial nerve deficit.  Skin: Skin is warm and dry. No erythema.  Psychiatric: She has a normal mood and affect. Her behavior is normal. Judgment normal.     Female Chaperone present during breast and/or pelvic exam.  Assessment: 26 y.o. [id number] presenting for 6 week postpartum visit  Plan: Problem List Items Addressed This Visit    None    Visit Diagnoses    Postpartum care and examination    -  Primary       1) Contraception Education given regarding options for contraception:  she elects Depo after reviewing risks/benefits. She states that she will [facility].  2)  Pap - ASCCP guidelines and rational discussed.  Patient opts for routine screening interval  3) Patient underwent screening for postpartum depression with no concerns noted.  4) dizzyness and weakness: recommend she goes straight to ER when it happens again.  No acute symptoms today.  Exam normal.  5) Follow up 1 year for routine annual exam  Thomasene MohairStephen English Craighead, MD 06/27/2017 10:09 AM

## 2017-06-28 ENCOUNTER — Encounter: Payer: Self-pay | Admitting: Obstetrics and Gynecology

## 2018-03-06 ENCOUNTER — Telehealth: Payer: Self-pay

## 2018-03-06 NOTE — Telephone Encounter (Signed)
Pt called trying to get her results of her last pap. She wanted to come in to have her annual and have a pap smear done.

## 2018-03-13 ENCOUNTER — Encounter: Payer: Self-pay | Admitting: Obstetrics and Gynecology

## 2018-03-13 ENCOUNTER — Ambulatory Visit (INDEPENDENT_AMBULATORY_CARE_PROVIDER_SITE_OTHER): Payer: BLUE CROSS/BLUE SHIELD | Admitting: Obstetrics and Gynecology

## 2018-03-13 VITALS — BP 108/68 | HR 84 | Ht 64.0 in | Wt 239.0 lb

## 2018-03-13 DIAGNOSIS — N921 Excessive and frequent menstruation with irregular cycle: Secondary | ICD-10-CM

## 2018-03-13 DIAGNOSIS — Z113 Encounter for screening for infections with a predominantly sexual mode of transmission: Secondary | ICD-10-CM

## 2018-03-13 DIAGNOSIS — Z0000 Encounter for general adult medical examination without abnormal findings: Secondary | ICD-10-CM | POA: Diagnosis not present

## 2018-03-13 DIAGNOSIS — Z124 Encounter for screening for malignant neoplasm of cervix: Secondary | ICD-10-CM | POA: Diagnosis not present

## 2018-03-13 MED ORDER — NORETHIN ACE-ETH ESTRAD-FE 1-20 MG-MCG(24) PO CAPS: 1.0000 | ORAL_CAPSULE | Freq: Every day | ORAL | 11 refills | Status: DC

## 2018-03-13 NOTE — Progress Notes (Signed)
Gynecology Annual Exam   PCP: Patient, No Pcp Per  Chief Complaint:  Chief Complaint  Patient presents with  . Gynecologic Exam    History of Present Illness: Patient is a 27 y.o. [id number] presents for annual exam. The patient has no complaints today.   LMP: Patient's last menstrual period was 03/02/2018 (exact date). Average Interval: irregular, not applicable days Duration of flow: not applicable Heavy Menses: not applicable Clots: yes Intermenstrual Bleeding: yes Postcoital Bleeding: no Dysmenorrhea: yes  The patient is sexually active. She currently uses Depo-Provera injections and IUD for contraception. She denies dyspareunia.  The patient does not perform self breast exams.  There is no notable family history of breast or ovarian cancer in her family.  The patient has regular exercise: not asked.    The patient denies current symptoms of depression.    Review of Systems: ROS  Past Medical History:  Past Medical History:  Diagnosis Date  . Abnormal uterine bleeding     Past Surgical History:  Past Surgical History:  Procedure Laterality Date  . PERIPHERAL VASCULAR THROMBECTOMY  2013   removed from left hand    Gynecologic History:  Patient's last menstrual period was 03/02/2018 (exact date). Contraception: Depo-Provera injections Last Pap: Results were: 2013   Obstetric History: G2P2002  Family History:  Family History  Problem Relation Age of Onset  . Diabetes Neg Hx   . Hypertension Neg Hx   . Stroke Neg Hx   . Cancer Neg Hx   . Thyroid disease Neg Hx     Social History:  Social History   Socioeconomic History  . Marital status: Single    Spouse name: Not on file  . Number of children: Not on file  . Years of education: Not on file  . Highest education level: Not on file  Occupational History  . Not on file  Social Needs  . Financial resource strain: Not on file  . Food insecurity:    Worry: Not on file    Inability: Not on file    . Transportation needs:    Medical: Not on file    Non-medical: Not on file  Tobacco Use  . Smoking status: Current Every Day Smoker    Packs/day: 0.25    Types: Cigarettes  . Smokeless tobacco: Never Used  Substance and Sexual Activity  . Alcohol use: Yes  . Drug use: No  . Sexual activity: Not Currently    Birth control/protection: Injection  Lifestyle  . Physical activity:    Days per week: 0 days    Minutes per session: 0 min  . Stress: Very much  Relationships  . Social connections:    Talks on phone: Not on file    Gets together: Not on file    Attends religious service: Not on file    Active member of club or organization: Not on file    Attends meetings of clubs or organizations: Not on file    Relationship status: Not on file  . Intimate partner violence:    Fear of current or ex partner: Not on file    Emotionally abused: Not on file    Physically abused: Not on file    Forced sexual activity: Not on file  Other Topics Concern  . Not on file  Social History Narrative  . Not on file    Allergies:  No Known Allergies  Medications: Prior to Admission medications   Medication Sig Start Date End Date  Taking? Authorizing Provider  aspirin 81 MG EC tablet Take 81 mg by mouth every evening. 02/09/17   [provider]  ferrous sulfate 325 (65 FE) MG tablet Take 1 tablet (325 mg total) by mouth daily with breakfast. Patient not taking: Reported on 06/27/2017 03/08/17   Farrel ConnersGutierrez, Colleen, CNM  Prenat w/o A Vit-FeFum-FePo-FA (CONCEPT OB) 130-92.4-1 MG CAPS Take 1 capsule by mouth daily. 04/20/17   Nadara MustardHarris, Robert P, MD    Physical Exam Vitals: unknown if currently breastfeeding.  General: NAD HEENT: normocephalic, anicteric Thyroid: no enlargement, no palpable nodules Pulmonary: No increased work of breathing, CTAB Cardiovascular: RRR, distal pulses 2+ Breast: Breast symmetrical, no tenderness, no palpable nodules or masses, no skin or nipple retraction  present, no nipple discharge.  No axillary or supraclavicular lymphadenopathy. Abdomen: NABS, soft, non-tender, non-distended.  Umbilicus without lesions.  No hepatomegaly, splenomegaly or masses palpable. No evidence of hernia  Genitourinary:  External: Normal external female genitalia.  Normal urethral meatus, normal Bartholin's and Skene's glands.    Vagina: Normal vaginal mucosa, no evidence of prolapse.    Cervix: Grossly normal in appearance, no bleeding  Uterus: Non-enlarged, mobile, normal contour.  No CMT  Adnexa: ovaries non-enlarged, no adnexal masses  Rectal: deferred  Lymphatic: no evidence of inguinal lymphadenopathy Extremities: no edema, erythema, or tenderness Neurologic: Grossly intact Psychiatric: mood appropriate, affect full  Female chaperone present for pelvic and breast  portions of the physical exam    Assessment: 27 y.o. G2P2002 routine annual exam  Plan: Problem List Items Addressed This Visit    None    Visit Diagnoses    Health care maintenance    -  Primary   Relevant Orders   CBC   TSH   Basic Metabolic Panel (BMET)   Screening examination for STD (sexually transmitted disease)       Relevant Orders   Pap IG, CT/NG w/ reflex HPV when ASC-U   Screening for cervical cancer       Relevant Orders   Pap IG, CT/NG w/ reflex HPV when ASC-U   Breakthrough bleeding on depo provera       Relevant Medications   Norethin Ace-Eth Estrad-FE (TAYTULLA) 1-20 MG-MCG(24) CAPS      2) STI screening  was offered and accepted  2)  ASCCP guidelines and rational discussed.  Patient opts for every 3 years screening interval  3) Contraception - the patient is currently using  Depo-Provera injections.  She is having abnormal vaginal bleeding for the last 3 weeks. Will attempt to add in an oral contraceptive pill with the Depo to improve break though bleeding.   4) Routine healthcare maintenance including cholesterol, diabetes screening discussed Ordered today  5)  Discussed HPV vaccination with the patient. She has been vaccinated. She would not like to be vaccinated for HPV today.   6)  Return in about 6 months (around 09/12/2018) for gyn visit.   Natale Milchhristanna R Ceazia Harb MD [facility], [facility] 03/13/2018, 6:34 PM

## 2018-03-14 LAB — BASIC METABOLIC PANEL
BUN / CREAT RATIO: 17 (ref 9–23)
BUN: 14 mg/dL (ref 6–20)
CO2: 21 mmol/L (ref 20–29)
Calcium: 9.6 mg/dL (ref 8.7–10.2)
Chloride: 104 mmol/L (ref 96–106)
Creatinine, Ser: 0.84 mg/dL (ref 0.57–1.00)
GFR, EST AFRICAN AMERICAN: 111 mL/min/{1.73_m2} (ref >59)
GFR, EST NON AFRICAN AMERICAN: 96 mL/min/{1.73_m2} (ref >59)
Glucose: 90 mg/dL (ref 65–99)
POTASSIUM: 4.6 mmol/L (ref 3.5–5.2)
Sodium: 140 mmol/L (ref 134–144)

## 2018-03-14 LAB — CBC
Hematocrit: 36.8 % (ref 34.0–46.6)
Hemoglobin: 11.9 g/dL (ref 11.1–15.9)
MCH: 26.7 pg (ref 26.6–33.0)
MCHC: 32.3 g/dL (ref 31.5–35.7)
MCV: 83 fL (ref 79–97)
PLATELETS: 355 10*3/uL (ref 150–379)
RBC: 4.45 x10E6/uL (ref 3.77–5.28)
RDW: 14.8 % (ref 12.3–15.4)
WBC: 7.4 10*3/uL (ref 3.4–10.8)

## 2018-03-14 LAB — TSH: TSH: 0.976 u[IU]/mL (ref 0.450–4.500)

## 2018-03-14 NOTE — Progress Notes (Signed)
Normal labs, released to mychart

## 2018-03-16 LAB — PAP IG, CT-NG, RFX HPV ASCU
Chlamydia, Nuc. Acid Amp: NEGATIVE
GONOCOCCUS BY NUCLEIC ACID AMP: NEGATIVE
PAP SMEAR COMMENT: 0

## 2018-03-21 NOTE — Progress Notes (Signed)
NIL, released to mychart

## 2018-04-12 IMAGING — US US OB COMP LESS 14 WK
1 series · 14 of 28 positions shown · non-contrast
Comparison: 04/26/2012

CLINICAL DATA: Abdominal pain and vaginal bleeding for 3 days. Un
known LMP.

EXAM:
OBSTETRIC <14 WK US AND TRANSVAGINAL OB US
TECHNIQUE: Both transabdominal and transvaginal ultrasound examinations were
performed for complete evaluation of the gestation as well as the
maternal uterus, adnexal regions, and pelvic cul-de-sac.
Transvaginal technique was performed to assess early pregnancy.

[Series 1: us ob comp less 14 wk · 0.20mm/px · 14 of 98 slices shown]
[im 4/98]
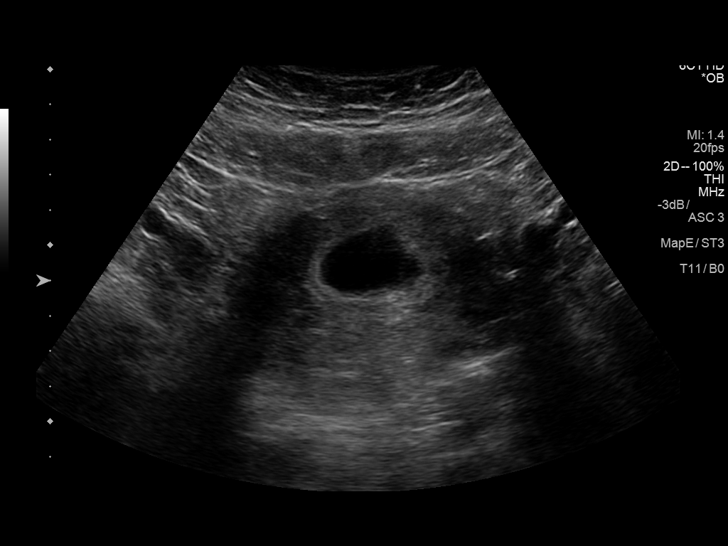
[im 11/98]
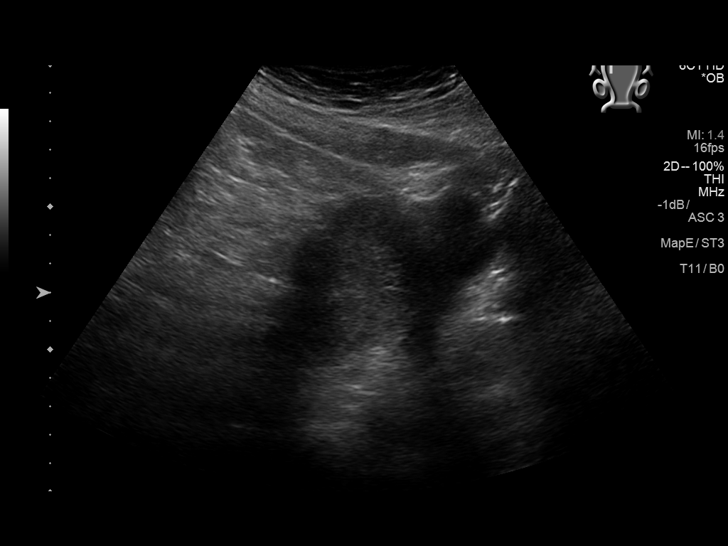
[im 18/98]
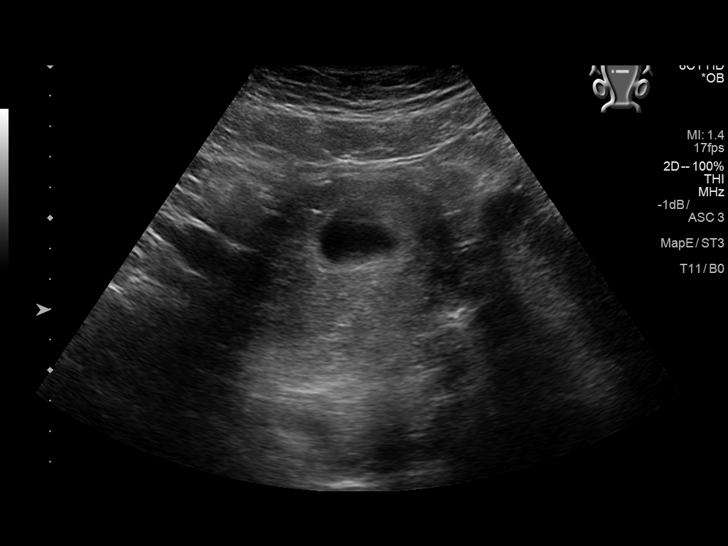
[im 26/98]
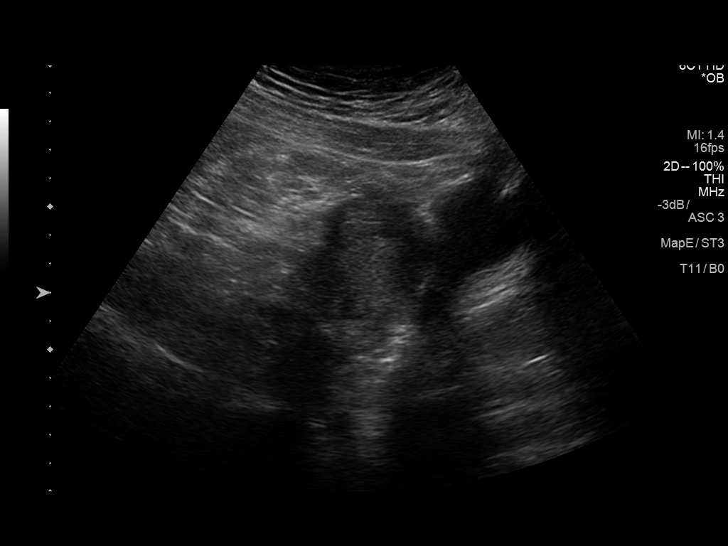
[im 33/98]
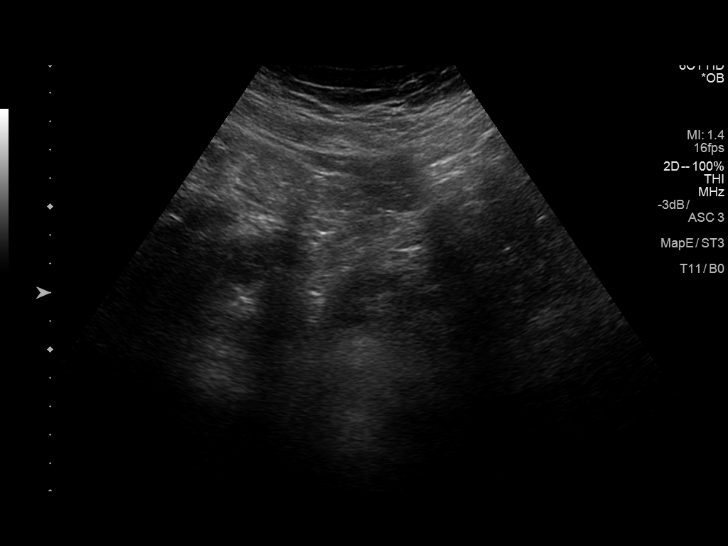
[im 40/98]
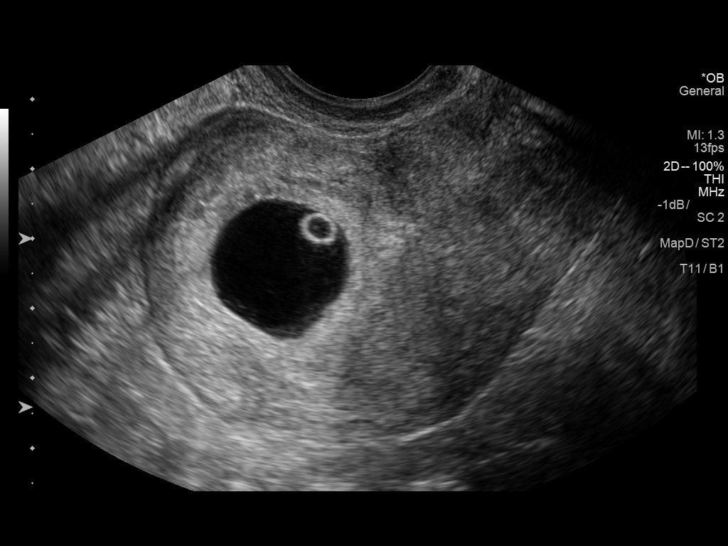
[im 47/98]
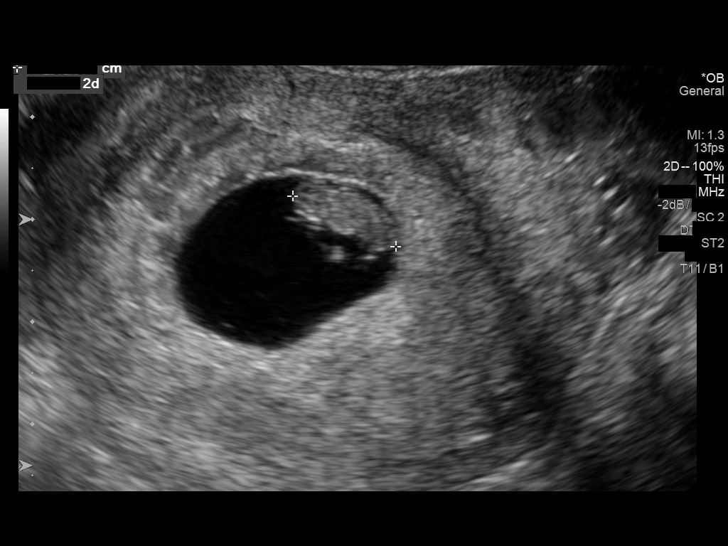
[im 54/98]
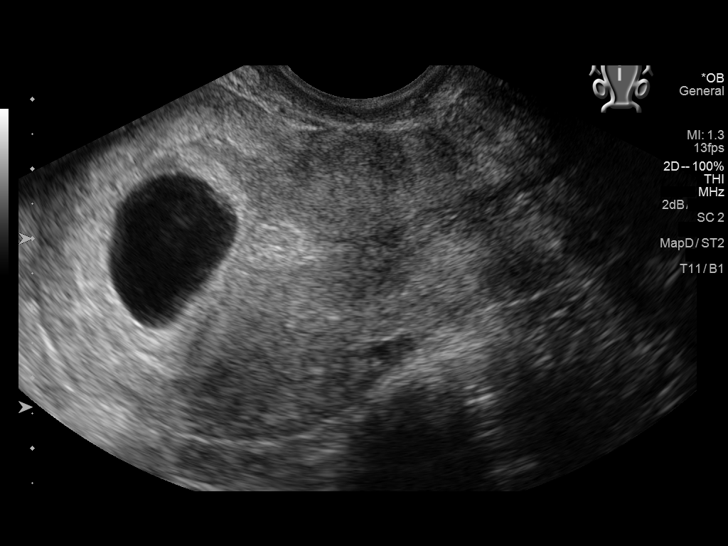
[im 62/98]
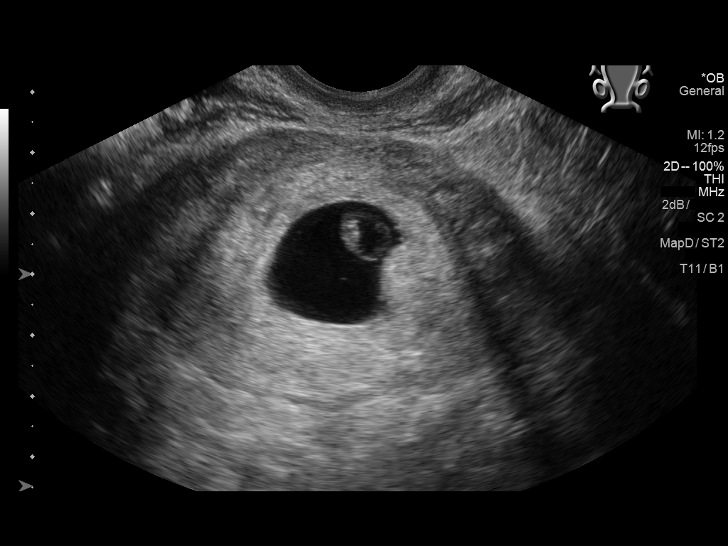
[im 69/98]
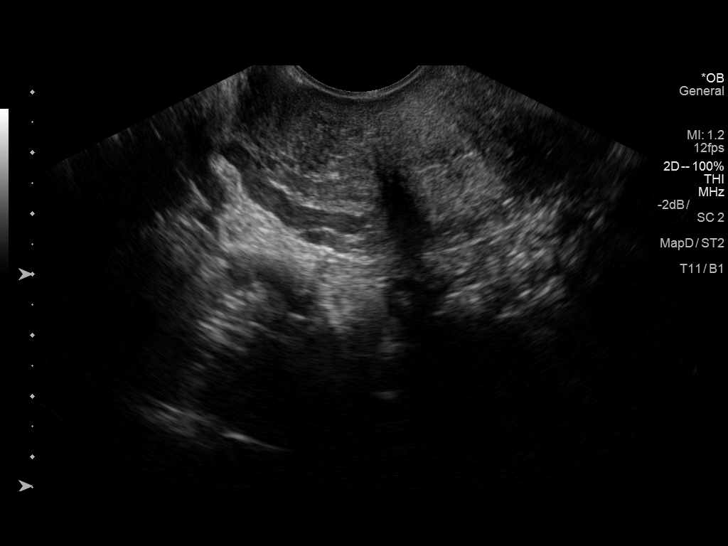
[im 76/98]
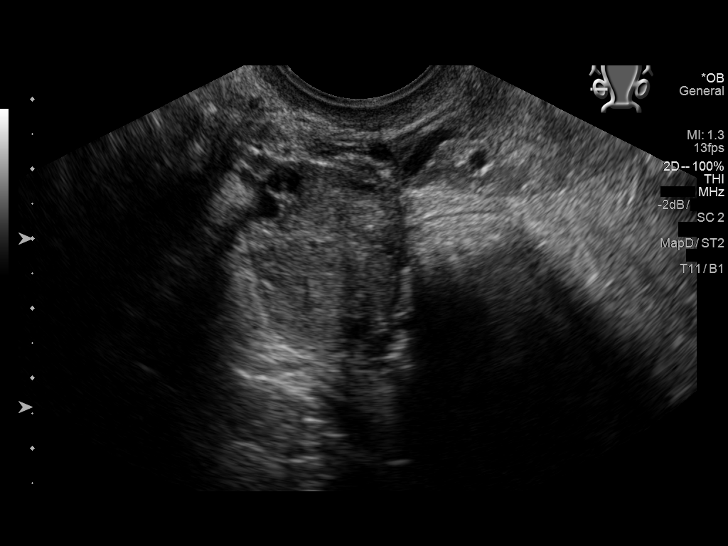
[im 83/98]
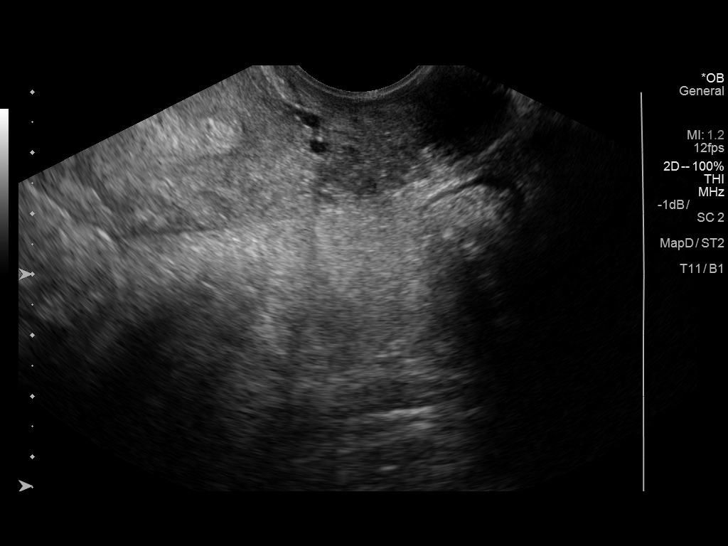
[im 90/98]
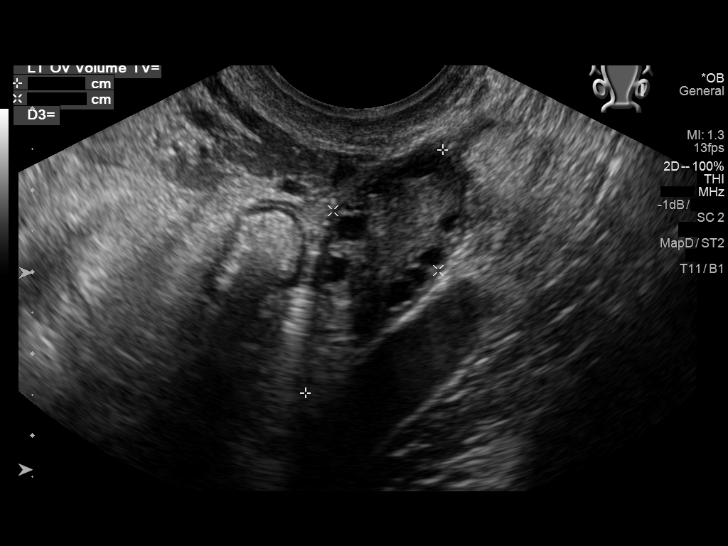
[im 98/98]
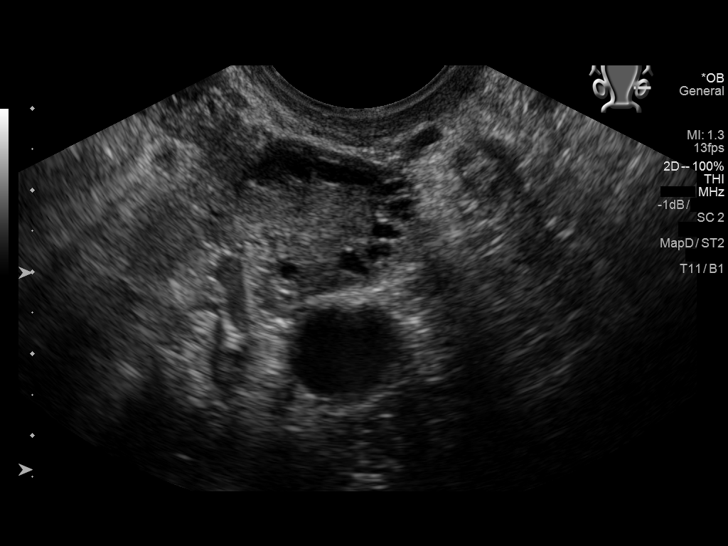

[14 of 28 positions shown; findings below may reference images not displayed]

FINDINGS: Intrauterine gestational sac: Single

Yolk sac:  Present

Embryo:  Present

Cardiac Activity: Present

Heart Rate: 145  bpm

CRL:  11  mm   7 w   2 d                  US EDC: 05/21/2017

Subchorionic hemorrhage:  None visualized.

Maternal uterus/adnexae: No free fluid. Benign appearing 2.3 cm
right ovarian cyst. Unremarkable left ovary.
IMPRESSION: Single living intrauterine gestation as above.

## 2018-05-28 ENCOUNTER — Encounter: Payer: Self-pay | Admitting: Obstetrics and Gynecology

## 2018-05-29 NOTE — Telephone Encounter (Signed)
Any generic birth control that her insurance will cover is okay. Perhaps Sprintec? Can you call the pharmacy and determine if Sprintec is covered?  Thank you, Dr. Jerene PitchSchuman

## 2018-05-30 ENCOUNTER — Other Ambulatory Visit: Payer: Self-pay | Admitting: Obstetrics and Gynecology

## 2018-05-30 DIAGNOSIS — IMO0001 Reserved for inherently not codable concepts without codable children: Secondary | ICD-10-CM

## 2018-05-30 MED ORDER — NORETHIN ACE-ETH ESTRAD-FE 1-20 MG-MCG(24) PO TABS: 1.0000 | ORAL_TABLET | Freq: Every day | ORAL | 11 refills | Status: DC

## 2018-05-30 NOTE — Telephone Encounter (Signed)
Prescription sent for blisolvi. Could you please let the patient know.  Thank you,  Dr. Jerene PitchSchuman

## 2018-11-28 HISTORY — PX: WISDOM TOOTH EXTRACTION: SHX21

## 2019-01-15 LAB — HM HIV SCREENING LAB: HM HIV Screening: NEGATIVE

## 2019-06-11 ENCOUNTER — Other Ambulatory Visit: Payer: Self-pay

## 2019-06-11 ENCOUNTER — Encounter: Payer: Self-pay | Admitting: Physician Assistant

## 2019-06-11 ENCOUNTER — Ambulatory Visit: Payer: Self-pay | Admitting: Physician Assistant

## 2019-06-11 DIAGNOSIS — Z113 Encounter for screening for infections with a predominantly sexual mode of transmission: Secondary | ICD-10-CM

## 2019-06-11 LAB — WET PREP FOR TRICH, YEAST, CLUE
Trichomonas Exam: NEGATIVE
Yeast Exam: NEGATIVE

## 2019-06-11 NOTE — Progress Notes (Signed)
    STI clinic/screening visit  Subjective:  Peggy Shaw is a 28 y.o. female being seen today for an STI screening visit. The patient reports they do not have symptoms.  Patient has the following medical conditionshas BMI 40.0-44.9, adult ([place]); Supervision of high risk pregnancy, antepartum, third trimester; Second pregnancy; Obesity complicating pregnancy, third trimester; Anemia complicating pregnancy, third trimester; and Labor and delivery indication for care or intervention on their problem list.  Chief Complaint  Patient presents with  . SEXUALLY TRANSMITTED DISEASE    Patient reports that she is not having any symptoms or concerns today.  Requests STD screening. HPI   See flowsheet for further details and programmatic requirements.    The following portions of the patient's history were reviewed and updated as appropriate: allergies, current medications, past family history, past medical history, past social history, past surgical history and problem list. Problem list updated.  Objective:  There were no vitals filed for this visit.  Physical Exam Constitutional:      General: She is not in acute distress.    Appearance: Normal appearance. She is obese.  HENT:     Head: Normocephalic and atraumatic.     Mouth/Throat:     Mouth: Mucous membranes are moist.     Pharynx: Oropharynx is clear. No oropharyngeal exudate or posterior oropharyngeal erythema.  Neck:     Musculoskeletal: Neck supple.  Pulmonary:     Effort: Pulmonary effort is normal.  Abdominal:     Palpations: Abdomen is soft. There is no mass.     Tenderness: There is no abdominal tenderness. There is no guarding or rebound.  Genitourinary:    General: Normal vulva.     Rectum: Normal.     Comments: External genitalia/pubic area without nits, lice, edema, erythema, lesions or inguinal adenopathy. Vagina with normal mucosa and discharge, cervix without visible lesions Uterus firm, mobile, nt, no CMT,  no masses, no adnexal tenderness or fullness. Lymphadenopathy:     Cervical: No cervical adenopathy.  Skin:    General: Skin is warm and dry.     Findings: No bruising, erythema, lesion or rash.  Neurological:     Mental Status: She is alert and oriented to person, place, and time.  Psychiatric:        Mood and Affect: Mood normal.        Behavior: Behavior normal.       Assessment and Plan:  Peggy Shaw is a 28 y.o. female presenting to the [facility] for STI screening  1. Screening for STD (sexually transmitted disease) Patient is without symptoms today. Reviewed wet mount with RN and no treatment indicated today Rec condoms with all sex Await test results.  Counseled that RN will call if she needs to RTC for treatment once results are back. - WET PREP FOR [place], YEAST, [place] [facility] - Syphilis Serology, [place] [facility] - Gonococcus culture     No follow-ups on file.  No future appointments.  Jerene Dilling, PA

## 2019-06-11 NOTE — Progress Notes (Signed)
Wet mount reviewed by provider, no tx today. Provider orders completed. 

## 2019-06-16 LAB — GONOCOCCUS CULTURE

## 2020-01-07 ENCOUNTER — Ambulatory Visit: Payer: Medicaid Other | Admitting: Physician Assistant

## 2020-01-07 ENCOUNTER — Encounter: Payer: Self-pay | Admitting: Physician Assistant

## 2020-01-07 ENCOUNTER — Other Ambulatory Visit: Payer: Self-pay

## 2020-01-07 DIAGNOSIS — Z113 Encounter for screening for infections with a predominantly sexual mode of transmission: Secondary | ICD-10-CM

## 2020-01-07 LAB — WET PREP FOR TRICH, YEAST, CLUE
Trichomonas Exam: NEGATIVE
Yeast Exam: NEGATIVE

## 2020-01-07 NOTE — Progress Notes (Signed)
In for STD screening; denies s/s Sharlette Dense, RN [employer] prep reviewed-no Tx indicated Sharlette Dense, RN

## 2020-01-08 NOTE — Progress Notes (Signed)
  [facility] Department STI clinic/screening visit  Subjective:  Peggy Shaw is a 29 y.o. female being seen today for an STI screening visit. The patient reports they do not have symptoms.  Patient reports that they do not desire a pregnancy in the next year.   They reported they are not interested in discussing contraception today.  Patient's last menstrual period was 12/18/2019 (exact date).   Patient has the following medical conditions:   Patient Active Problem List   Diagnosis Date Noted  . BMI 40.0-44.9, adult [facility]) 01/30/2017    Chief Complaint  Patient presents with  . SEXUALLY TRANSMITTED DISEASE    HPI  Patient reports that she is not having symptoms but would like a screening today.  LMP was 12/18/19 and normal.  Using condoms with all sex as BCM.  See flowsheet for further details and programmatic requirements.    The following portions of the patient's history were reviewed and updated as appropriate: allergies, current medications, past medical history, past social history, past surgical history and problem list.  Objective:  There were no vitals filed for this visit.  Physical Exam Constitutional:      General: She is not in acute distress.    Appearance: Normal appearance. She is obese.  HENT:     Head: Normocephalic and atraumatic.     Comments: No nits, lice, or hair loss. No cervical, supraclavicular or axillary adenopathy.    Mouth/Throat:     Mouth: Mucous membranes are moist.     Pharynx: Oropharynx is clear. No oropharyngeal exudate or posterior oropharyngeal erythema.  Eyes:     Conjunctiva/sclera: Conjunctivae normal.  Pulmonary:     Effort: Pulmonary effort is normal.  Abdominal:     Palpations: Abdomen is soft. There is no mass.     Tenderness: There is no abdominal tenderness. There is no guarding or rebound.  Genitourinary:    General: Normal vulva.     Rectum: Normal.     Comments: External genitalia/pubic area without  nits, lice, edema, erythema, lesions and inguinal adenopathy. Vagina with normal mucosa and discharge. Cervix without visible lesions. Uterus firm, mobile, nt, no masses, no CMT, no adnexal tenderness or fullness. Musculoskeletal:     Cervical back: Neck supple. No tenderness.  Skin:    General: Skin is warm and dry.     Findings: No bruising, erythema, lesion or rash.  Neurological:     Mental Status: She is alert and oriented to person, place, and time.  Psychiatric:        Mood and Affect: Mood normal.        Behavior: Behavior normal.        Thought Content: Thought content normal.        Judgment: Judgment normal.      Assessment and Plan:  Peggy Shaw is a 29 y.o. female presenting to the [facility] Department for STI screening  1. Screening for STD (sexually transmitted disease) Patient into clinic without symptoms. Rec condoms with all sex. Await test results.  Counseled that RN will call if needs to RTC for treatment once results are back. - WET PREP FOR TRICH, YEAST, CLUE - Gonococcus culture - Chlamydia/Gonorrhea [place] [facility] - HIV [place] [facility] - Syphilis Serology, [place]     No follow-ups on file.  No future appointments.  Matt Holmes, PA

## 2020-01-11 LAB — GONOCOCCUS CULTURE

## 2020-05-28 ENCOUNTER — Ambulatory Visit: Payer: Medicaid Other | Admitting: Physician Assistant

## 2020-05-28 ENCOUNTER — Other Ambulatory Visit: Payer: Self-pay

## 2020-05-28 ENCOUNTER — Encounter: Payer: Self-pay | Admitting: Physician Assistant

## 2020-05-28 DIAGNOSIS — Z113 Encounter for screening for infections with a predominantly sexual mode of transmission: Secondary | ICD-10-CM

## 2020-05-28 LAB — WET PREP FOR TRICH, YEAST, CLUE
Trichomonas Exam: NEGATIVE
Yeast Exam: NEGATIVE

## 2020-05-28 NOTE — Progress Notes (Signed)
Wet mount reviewed, no tx per provider orders. Provider orders completed. 

## 2020-05-28 NOTE — Progress Notes (Signed)
[facility] Department STI clinic/screening visit  Subjective:  Peggy Shaw is a 29 y.o. female being seen today for an STI screening visit. The patient reports they do have symptoms.  Patient reports that they do not desire a pregnancy in the next year.   They reported they are not interested in discussing contraception today.  Patient's last menstrual period was 05/04/2020 (exact date).   Patient has the following medical conditions:   Patient Active Problem List   Diagnosis Date Noted  . BMI 40.0-44.9, adult [facility]) 01/30/2017    Chief Complaint  Patient presents with  . SEXUALLY TRANSMITTED DISEASE    screening    HPI  Patient reports that she is not really having symptoms except occasionally with have some slight itching and white discharge.  Denies chronic conditions, surgeries, and regular medicines.  States last pap was about 3 years ago and last HIV test was 12/2019.  See flowsheet for further details and programmatic requirements.    The following portions of the patient's history were reviewed and updated as appropriate: allergies, current medications, past medical history, past social history, past surgical history and problem list.  Objective:  There were no vitals filed for this visit.  Physical Exam Constitutional:      General: She is not in acute distress.    Appearance: Normal appearance.  HENT:     Head: Normocephalic and atraumatic.     Comments: No nits, lice, or hair loss. No cervical, supraclavicular or axillary adenopathy.    Mouth/Throat:     Mouth: Mucous membranes are moist.     Pharynx: Oropharynx is clear. No oropharyngeal exudate or posterior oropharyngeal erythema.  Eyes:     Conjunctiva/sclera: Conjunctivae normal.  Pulmonary:     Effort: Pulmonary effort is normal.  Abdominal:     Palpations: Abdomen is soft. There is no mass.     Tenderness: There is no abdominal tenderness. There is no guarding or rebound.   Genitourinary:    General: Normal vulva.     Rectum: Normal.     Comments: External genitalia/pubic area without nits, lice, edema, erythema, lesions and inguinal adenopathy. Vagina with normal mucosa and discharge. Cervix without visible lesions. Uterus firm, mobile, nt, no masses, no CMT, no adnexal tenderness or fullness. Musculoskeletal:     Cervical back: Neck supple. No tenderness.  Skin:    General: Skin is warm and dry.     Findings: No bruising, erythema, lesion or rash.  Neurological:     Mental Status: She is alert and oriented to person, place, and time.  Psychiatric:        Mood and Affect: Mood normal.        Behavior: Behavior normal.        Thought Content: Thought content normal.        Judgment: Judgment normal.      Assessment and Plan:  JACOB CHAMBLEE is a 29 y.o. female presenting to the [facility] Department for STI screening  1. Screening for STD (sexually transmitted disease) Patient into clinic without symptoms. Counseled patient re:  Normal vs abnormal vaginal discharge and that normal discharge varies related to hormone changes with menstrual cycle. Rec condoms with all sex. Await test results.  Counseled that RN will call if needs to RTC for treatment once results are back. - WET PREP FOR TRICH, YEAST, CLUE - Gonococcus culture - Chlamydia/Gonorrhea [place] [facility] - HIV [place] [facility] - Syphilis Serology,  Lab  No follow-ups on file.  No future appointments.  Jerene Dilling, PA

## 2020-06-02 LAB — GONOCOCCUS CULTURE

## 2020-11-09 ENCOUNTER — Emergency Department
Admission: EM | Admit: 2020-11-09 | Discharge: 2020-11-09 | Disposition: A | Discharge status: Home / Self Care | Payer: Medicaid Other | Attending: Emergency Medicine | Admitting: Emergency Medicine

## 2020-11-09 ENCOUNTER — Other Ambulatory Visit: Payer: Self-pay

## 2020-11-09 DIAGNOSIS — J028 Acute pharyngitis due to other specified organisms: Secondary | ICD-10-CM | POA: Insufficient documentation

## 2020-11-09 DIAGNOSIS — B9789 Other viral agents as the cause of diseases classified elsewhere: Secondary | ICD-10-CM | POA: Insufficient documentation

## 2020-11-09 DIAGNOSIS — Z20822 Contact with and (suspected) exposure to covid-19: Secondary | ICD-10-CM | POA: Insufficient documentation

## 2020-11-09 DIAGNOSIS — J029 Acute pharyngitis, unspecified: Secondary | ICD-10-CM | POA: Diagnosis present

## 2020-11-09 DIAGNOSIS — F17210 Nicotine dependence, cigarettes, uncomplicated: Secondary | ICD-10-CM | POA: Diagnosis not present

## 2020-11-09 DIAGNOSIS — J069 Acute upper respiratory infection, unspecified: Secondary | ICD-10-CM | POA: Insufficient documentation

## 2020-11-09 LAB — RESP PANEL BY RT-PCR (FLU A&B, COVID) ARPGX2
Influenza A by PCR: NEGATIVE
Influenza B by PCR: NEGATIVE
SARS Coronavirus 2 by RT PCR: NEGATIVE

## 2020-11-09 LAB — GROUP A STREP BY PCR: Group A Strep by PCR: NOT DETECTED

## 2020-11-09 MED ORDER — IBUPROFEN 600 MG PO TABS: 600.0000 mg | ORAL_TABLET | Freq: Three times a day (TID) | ORAL | 0 refills | Status: DC | PRN

## 2020-11-09 MED ORDER — PSEUDOEPH-BROMPHEN-DM 30-2-10 MG/5ML PO SYRP
5.0000 mL | ORAL_SOLUTION | Freq: Four times a day (QID) | ORAL | 0 refills | Status: DC | PRN
Start: 2020-11-09 — End: 2022-07-13

## 2020-11-09 MED ORDER — LIDOCAINE VISCOUS HCL 2 % MT SOLN
5.0000 mL | Freq: Four times a day (QID) | OROMUCOSAL | 0 refills | Status: DC | PRN
Start: 2020-11-09 — End: 2022-07-13

## 2020-11-09 NOTE — ED Triage Notes (Signed)
Pt states she woke up with sore throat, fever, chills this morning.

## 2020-11-09 NOTE — Discharge Instructions (Signed)
Your test was negative for strep pharyngitis, COVID-19, and influenza.  Follow discharge care instruction take medication as directed.

## 2020-11-09 NOTE — ED Provider Notes (Signed)
[facility] Emergency Department Provider Note   ____________________________________________   Event Date/Time   First MD Initiated Contact with Patient 11/09/20 [phone]     (approximate)  I have reviewed the triage vital signs and the nursing notes.   HISTORY  Chief Complaint URI    HPI Peggy Shaw is a 29 y.o. female patient with this morning with chills and sore throat.  Denies fever.  Patient denies loss of taste or smell.  Patient denies recent travel or known contact with COVID-19.  Patient is not taking COVID-19 vaccine or the flu shot.  Patient can tolerate food and fluids.  Rates pain as a 10/10.  Described pain as "sore".  No palliative measure for complaint.         Past Medical History:  Diagnosis Date  . Abnormal uterine bleeding     Patient Active Problem List   Diagnosis Date Noted  . BMI 40.0-44.9, adult (HCC) 01/30/2017    Past Surgical History:  Procedure Laterality Date  . 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 Authorizing Provider  brompheniramine-pseudoephedrine-DM 30-2-10 MG/5ML syrup Take 5 mLs by mouth 4 (four) times daily as needed. Mix with 5 mL of viscous lidocaine for swish and swallow. 11/09/20   Joni Reining, PA-C  ibuprofen (ADVIL) 600 MG tablet Take 1 tablet (600 mg total) by mouth every 8 (eight) hours as needed. 11/09/20   Joni Reining, PA-C  lidocaine (XYLOCAINE) 2 % solution Use as directed 5 mLs in the mouth or throat every 6 (six) hours as needed for mouth pain. Mix with 5 mL of Bromfed-DM for swish and swallow. 11/09/20   Joni Reining, PA-C  medroxyPROGESTERone (DEPO-PROVERA) 150 MG/ML injection Inject 150 mg into the muscle every 3 (three) months.    [provider]  Norethindrone Acetate-Ethinyl Estrad-FE (BLISOVI 24 FE) 1-20 MG-MCG(24) tablet Take 1 tablet by mouth daily. Patient  not taking: Reported on 01/08/2020 05/30/18   Natale Milch, MD    Allergies Patient has no known allergies.  Family History  Problem Relation Age of Onset  . Diabetes Neg Hx   . Hypertension Neg Hx   . Stroke Neg Hx   . Cancer Neg Hx   . Thyroid disease Neg Hx     Social History Social History   Tobacco Use  . Smoking status: Current Every Day Smoker    Packs/day: 0.25    Types: Cigarettes  . Smokeless tobacco: Never Used  Vaping Use  . Vaping Use: Never used  Substance Use Topics  . Alcohol use: Yes  . Drug use: No    Review of Systems Constitutional: Chills.   Eyes: No visual changes. ENT: Sore throat. Cardiovascular: Denies chest pain. Respiratory: Denies shortness of breath. Gastrointestinal: No abdominal pain.  No nausea, no vomiting.  No diarrhea.  No constipation. Genitourinary: Negative for dysuria. Musculoskeletal: Negative for back pain. Skin: Negative for rash. Neurological: Negative for headaches, focal weakness or numbness.   ____________________________________________   PHYSICAL EXAM:  VITAL SIGNS: ED Triage Vitals  Enc Vitals Group     BP 11/09/20 0726 (!) 106/59     Pulse Rate 11/09/20 0726 73     Resp 11/09/20 0726 17     Temp 11/09/20 0726 99.4 F (37.4 C)     Temp Source 11/09/20 0726 Oral     SpO2 11/09/20  0726 100 %     Weight 11/09/20 0728 238 lb (108 kg)     Height 11/09/20 0728 5\' 4"  (1.626 m)     Head Circumference --      Peak Flow --      Pain Score 11/09/20 0727 10     Pain Loc --      Pain Edu? --      Excl. in GC? --     Constitutional: Alert and oriented. Well appearing and in no acute distress. Eyes: Conjunctivae are normal. PERRL. EOMI. Head: Atraumatic. Nose: No congestion/rhinnorhea. Mouth/Throat: Mucous membranes are moist.  Oropharynx erythematous. Neck: No stridor.  Hematological/Lymphatic/Immunilogical: Right cervical lymphadenopathy. Cardiovascular: Normal rate, regular rhythm. Grossly normal  heart sounds.  Good peripheral circulation. Respiratory: Normal respiratory effort.  No retractions. Lungs CTAB. Gastrointestinal: Soft and nontender. No distention. No abdominal bruits. No CVA tenderness. Genitourinary: Deferred Skin:  Skin is warm, dry and intact. No rash noted. Psychiatric: Mood and affect are normal. Speech and behavior are normal.  ____________________________________________   LABS (all labs ordered are listed, but only abnormal results are displayed)  Labs Reviewed  GROUP A STREP BY PCR  RESP PANEL BY RT-PCR (FLU A&B, COVID) [id number]   ____________________________________________  EKG   ____________________________________________  RADIOLOGY I, 11/11/20, personally viewed and evaluated these images (plain radiographs) as part of my medical decision making, as well as reviewing the written report by the radiologist.  ED MD interpretation:    Official radiology report(s): No results found.  ____________________________________________   PROCEDURES  Procedure(s) performed (including Critical Care):  Procedures   ____________________________________________   INITIAL IMPRESSION / ASSESSMENT AND PLAN / ED COURSE  As part of my medical decision making, I reviewed the following data within the electronic MEDICAL RECORD NUMBER         Patient presents with sore throat, fever, chills upon in awakening.  Patient tested negative for COVID-19, influenza, and strep pharyngitis.  Patient complaint physical exam consistent with viral illness.  Patient given discharge care instructions, work excuse, and advised take medication as directed.  Follow-up with PCP.      ____________________________________________   FINAL CLINICAL IMPRESSION(S) / ED DIAGNOSES  Final diagnoses:  Upper respiratory tract infection, unspecified type  Viral pharyngitis     ED Discharge Orders         Ordered    brompheniramine-pseudoephedrine-DM 30-2-10 MG/5ML syrup   4 times daily PRN        11/09/20 1056    lidocaine (XYLOCAINE) 2 % solution  Every 6 hours PRN        11/09/20 1056    ibuprofen (ADVIL) 600 MG tablet  Every 8 hours PRN        11/09/20 1056          *Please note:  Peggy Shaw was evaluated in Emergency Department on 11/09/2020 for the symptoms described in the history of present illness. She was evaluated in the context of the global COVID-19 pandemic, which necessitated consideration that the patient might be at risk for infection with the SARS-CoV-2 virus that causes COVID-19. Institutional protocols and algorithms that pertain to the evaluation of patients at risk for COVID-19 are in a state of rapid change based on information released by regulatory bodies including the CDC and federal and state organizations. These policies and algorithms were followed during the patient's care in the ED.  Some ED evaluations and interventions may be delayed as a result of limited staffing during and  the pandemic.*   Note:  This document was prepared using Dragon voice recognition software and may include unintentional dictation errors.    Joni Reining, PA-C 11/09/20 1058    Minna Antis, MD 11/09/20 1356

## 2020-11-09 NOTE — ED Notes (Signed)
Pt given another warm blanket.  

## 2020-11-09 NOTE — ED Notes (Signed)
Patient verbalizes understanding of discharge instructions. Opportunity for questioning and answers were provided. Armband removed by staff, pt discharged from ED. Ambulated out to lobby  

## 2021-01-05 ENCOUNTER — Ambulatory Visit: Payer: Medicaid Other

## 2021-01-06 ENCOUNTER — Other Ambulatory Visit: Payer: Self-pay | Admitting: Family Medicine

## 2021-01-06 ENCOUNTER — Other Ambulatory Visit: Payer: Self-pay

## 2021-01-06 ENCOUNTER — Ambulatory Visit (LOCAL_COMMUNITY_HEALTH_CENTER): Payer: Medicaid Other | Admitting: Family Medicine

## 2021-01-06 VITALS — BP 119/68 | Ht 64.0 in | Wt 231.2 lb

## 2021-01-06 DIAGNOSIS — Z309 Encounter for contraceptive management, unspecified: Secondary | ICD-10-CM

## 2021-01-06 DIAGNOSIS — Z3009 Encounter for other general counseling and advice on contraception: Secondary | ICD-10-CM

## 2021-01-06 DIAGNOSIS — Z01419 Encounter for gynecological examination (general) (routine) without abnormal findings: Secondary | ICD-10-CM

## 2021-01-06 DIAGNOSIS — Z1272 Encounter for screening for malignant neoplasm of vagina: Secondary | ICD-10-CM

## 2021-01-06 DIAGNOSIS — Z113 Encounter for screening for infections with a predominantly sexual mode of transmission: Secondary | ICD-10-CM

## 2021-01-06 LAB — WET PREP FOR TRICH, YEAST, CLUE
Trichomonas Exam: NEGATIVE
Yeast Exam: NEGATIVE

## 2021-01-06 NOTE — Progress Notes (Signed)
[facility] DEPARTMENT [facility] [address] Main Number: [phone]    Family Planning Visit- Initial Visit  Subjective:  Peggy Shaw is a 30 y.o.  G2P2002   being seen today for an initial well woman visit and to discuss family planning options.  She is currently using None for pregnancy prevention. Patient reports she does not want a pregnancy in the next year.  Patient has the following medical conditions has BMI 40.0-44.9, adult [facility]) on their problem list.  Chief Complaint  Patient presents with  . Gynecologic Exam    Physical     Patient reports here for physical, had questions about monthly "bump" that appears before menses. Also want STI testing.   Patient denies any s/sx of STI or other problems or concerns.    Body mass index is 39.69 kg/m. - Patient is eligible for diabetes screening based on BMI and age >12?  no HA1C ordered? not applicable  Patient reports 2  partner/s in last year. Desires STI screening?  Yes  Has patient been screened once for HCV in the past?  No  No results found for: HCVAB  Does the patient have current drug use (including MJ), have a partner with drug use, and/or has been incarcerated since last result? No  If yes-- Screen for HCV through [facility]   Does the patient meet criteria for HBV testing? No  Criteria:  -Household, sexual or needle sharing contact with HBV -History of drug use -HIV positive -Those with known Hep C   Health Maintenance Due  Topic Date Due  . Hepatitis C Screening  Never done  . COVID-19 Vaccine (1) Never done  . INFLUENZA VACCINE  06/28/2020    Review of Systems  Constitutional: Negative for chills, fever, malaise/fatigue and weight loss.  HENT: Negative for congestion, hearing loss and sore throat.   Eyes: Negative for blurred vision, double vision and photophobia.  Respiratory: Negative for shortness of breath.   Cardiovascular: Negative for chest  pain.  Gastrointestinal: Negative for abdominal pain, blood in stool, constipation, diarrhea, heartburn, nausea and vomiting.  Genitourinary: Negative for dysuria and frequency.  Musculoskeletal: Negative for back pain, joint pain and neck pain.  Skin: Negative for itching and rash.  Neurological: Positive for headaches. Negative for dizziness and weakness.       Reports HA when time for menses.   Endo/Heme/Allergies: Does not bruise/bleed easily.  Psychiatric/Behavioral: Negative for depression, substance abuse and suicidal ideas.    The following portions of the patient's history were reviewed and updated as appropriate: allergies, current medications, past family history, past medical history, past social history, past surgical history and problem list. Problem list updated.   See flowsheet for other program required questions.  Objective:   Vitals:   01/06/21 1548  BP: 119/68  Weight: 231 lb 3.2 oz (104.9 kg)  Height: 5\' 4"  (1.626 m)    Physical Exam Vitals and nursing note reviewed.  Constitutional:      Appearance: Normal appearance.  HENT:     Head: Normocephalic and atraumatic.     Mouth/Throat:     Mouth: Mucous membranes are moist.     Pharynx: No oropharyngeal exudate or posterior oropharyngeal erythema.  Eyes:     General: No scleral icterus. Neck:     Thyroid: No thyroid mass, thyromegaly or thyroid tenderness.  Cardiovascular:     Rate and Rhythm: Normal rate.     Pulses: Normal pulses.  Pulmonary:  Effort: Pulmonary effort is normal.  Abdominal:     General: Abdomen is flat. Bowel sounds are normal.     Palpations: Abdomen is soft.  Genitourinary:    General: Normal vulva.     Rectum: Normal.     Comments: External genitalia without, lice, nits, erythema, edema , lesions or inguinal adenopathy. Vagina with normal mucosa and discharge and pH > 4.  Cervix without visual lesions, uterus firm, mobile, non-tender, no masses, CMT adnexal fullness or  tenderness.   Musculoskeletal:        General: Normal range of motion.     Cervical back: Normal range of motion and neck supple.  Skin:    General: Skin is warm and dry.  Neurological:     General: No focal deficit present.     Mental Status: She is alert.       Assessment and Plan:  Peggy Shaw is a 30 y.o. female presenting to the [facility] Department for an initial well woman exam/family planning visit  Contraception counseling: Reviewed all forms of birth control options in the tiered based approach. available including abstinence; over the counter/barrier methods; hormonal contraceptive medication including pill, patch, ring, injection,contraceptive implant, ECP; hormonal and nonhormonal IUDs; permanent sterilization options including vasectomy and the various tubal sterilization modalities. Risks, benefits, and typical effectiveness rates were reviewed.  Questions were answered.  Written information was also given to the patient to review.  Patient desires no birth control at this, this was prescribed for patient.   Patient was offered ECP based on last sex. ECP was not accepted by the patient. ECP counseling was not given.    1. Smear, vaginal, as part of routine gynecological examination - IGP, rfx Aptima HPV ASCU  2. Screening examination for venereal disease - Chlamydia/Gonorrhea [place] [facility] - Syphilis Serology, [place] [facility] - WET PREP FOR TRICH, YEAST, CLUE - HIV [place] [facility]  Well woman exam, with pap today, results pending.   Discussed with patient about surges of hormones that happen before cycle starts and can cause HA and other changes.  Patient reports that HA are eased with tylenol/ ibuprofen.  Denies n/v, photophobia or tinnitus with HA.   CBE completed today Breasts:        Right: Normal. No swelling, mass, nipple discharge, skin change or tenderness.        Left: Normal. No swelling, mass, nipple discharge, skin change or tenderness.    Patient accepted all screenings including wet prep, oral, vaginal CT/GC and bloodwork for HIV/RPR.  Patient meets criteria for HepB screening? No. Ordered? No - does not meet criteria  Patient meets criteria for HepC screening? No. Ordered? No - does not meet critera   Wet prep results neg, no treatment needed.      Discussed time line for [facility] results and that patient will be called with positive results and encouraged patient to call if she had not heard in 2 weeks.  Counseled to return or seek care for continued or worsening symptoms Recommended condom use with all sex  Patient is currently using no method to prevent pregnancy.     Return in about 1 year (around 01/06/2022) for annual well woman exam.  No future appointments.  Wendi Snipes, FNP

## 2021-01-06 NOTE — Progress Notes (Signed)
Patient here for physical, using condoms for [facility]. Last pap with HPV in 2019 is normal.   Harvie Heck, RN  Post:  RN reviewed wet mount with patient. No tx per S.O. Provider orders complete.   Harvie Heck, RN

## 2021-01-08 LAB — IGP, RFX APTIMA HPV ASCU: PAP Smear Comment: 0

## 2021-01-11 LAB — GONOCOCCUS CULTURE

## 2021-03-04 ENCOUNTER — Other Ambulatory Visit: Payer: Self-pay

## 2021-03-04 ENCOUNTER — Ambulatory Visit: Payer: Medicaid Other | Admitting: Family Medicine

## 2021-03-04 ENCOUNTER — Encounter: Payer: Self-pay | Admitting: Family Medicine

## 2021-03-04 DIAGNOSIS — Z113 Encounter for screening for infections with a predominantly sexual mode of transmission: Secondary | ICD-10-CM

## 2021-03-04 DIAGNOSIS — A599 Trichomoniasis, unspecified: Secondary | ICD-10-CM

## 2021-03-04 LAB — WET PREP FOR TRICH, YEAST, CLUE
Trichomonas Exam: POSITIVE — AB
Yeast Exam: NEGATIVE

## 2021-03-04 MED ORDER — METRONIDAZOLE 500 MG PO TABS: 500.0000 mg | ORAL_TABLET | Freq: Two times a day (BID) | ORAL | 0 refills | Status: AC

## 2021-03-04 MED ORDER — METRONIDAZOLE 500 MG PO TABS: 500.0000 mg | ORAL_TABLET | Freq: Two times a day (BID) | ORAL | 0 refills | Status: DC

## 2021-03-04 NOTE — Progress Notes (Signed)
[facility] Department STI clinic/screening visit  Subjective:  Peggy Shaw is a 30 y.o. female being seen today for an STI screening visit. The patient reports they do have symptoms.  Patient reports that they do not desire a pregnancy in the next year.   They reported they are not interested in discussing contraception today.  Patient's last menstrual period was 12/22/2020 (exact date).   Patient has the following medical conditions:   Patient Active Problem List   Diagnosis Date Noted  . BMI 40.0-44.9, adult [facility]) 01/30/2017    Chief Complaint  Patient presents with  . SEXUALLY TRANSMITTED DISEASE    STD Screening including bloodwork    HPI  Patient reports having vaginal irritation since TAB on 312/22.    Last HIV test per patient/review of record was 01/06/2021 Patient reports last pap was 01/06/2021  See flowsheet for further details and programmatic requirements.    The following portions of the patient's history were reviewed and updated as appropriate: allergies, current medications, past medical history, past social history, past surgical history and problem list.  Objective:  There were no vitals filed for this visit.  Physical Exam Vitals and nursing note reviewed.  Constitutional:      Appearance: Normal appearance.  HENT:     Head: Normocephalic and atraumatic.     Mouth/Throat:     Mouth: Mucous membranes are moist.     Pharynx: Oropharynx is clear. No oropharyngeal exudate or posterior oropharyngeal erythema.  Pulmonary:     Effort: Pulmonary effort is normal.  Chest:  Breasts:     Right: No axillary adenopathy or supraclavicular adenopathy.     Left: No axillary adenopathy or supraclavicular adenopathy.    Abdominal:     General: Abdomen is flat.     Palpations: There is no mass.     Tenderness: There is no abdominal tenderness. There is no rebound.  Genitourinary:    General: Normal vulva.     Exam position: Lithotomy  position.     Pubic Area: No rash or pubic lice.      Labia:        Right: No rash or lesion.        Left: No rash or lesion.      Vagina: Normal. No vaginal discharge, erythema, bleeding or lesions.     Cervix: No cervical motion tenderness, discharge, friability, lesion or erythema.     Uterus: Normal.      Adnexa: Right adnexa normal and left adnexa normal.     Rectum: Normal.     Comments: External genitalia without, lice, nits, erythema, edema , lesions or inguinal adenopathy. Vagina with normal mucosa and white discharge and pH >4.  Cervix without visual lesions, uterus firm, mobile, non-tender, no masses, CMT adnexal fullness or tenderness.   Lymphadenopathy:     Head:     Right side of head: No preauricular or posterior auricular adenopathy.     Left side of head: No preauricular or posterior auricular adenopathy.     Cervical: No cervical adenopathy.     Upper Body:     Right upper body: No supraclavicular or axillary adenopathy.     Left upper body: No supraclavicular or axillary adenopathy.     Lower Body: No right inguinal adenopathy. No left inguinal adenopathy.  Skin:    General: Skin is warm and dry.     Findings: No rash.  Neurological:     Mental Status: She is alert and oriented  to person, place, and time.  Psychiatric:        Mood and Affect: Mood normal.        Behavior: Behavior normal.      Assessment and Plan:  Peggy Shaw is a 30 y.o. female presenting to the [facility] Department for STI screening  1. Screening examination for venereal disease - Chlamydia/Gonorrhea [place] [facility] - HIV [place] [facility] - Syphilis Serology, [place] [facility] - WET PREP FOR TRICH, YEAST, CLUE  2. Trichomoniasis - metroNIDAZOLE (FLAGYL) 500 MG tablet; Take 1 tablet (500 mg total) by mouth 2 (two) times daily for 7 days.  Dispense: 14 tablet; Refill: 0  Patient accepted all screenings including oral, vaginal CT/GC and bloodwork for HIV/RPR.  Patient meets  criteria for HepB screening? No. Ordered? No - does not meet criteria  Patient meets criteria for HepC screening? No. Ordered? No - does not meet criteria   Wet prep results + amine + Trich    Treatment needed for Trich.  Discussed time line for [facility] Lab results and that patient will be called with positive results and encouraged patient to call if she had not heard in 2 weeks.  Counseled to return or seek care for continued or worsening symptoms Recommended condom use with all sex  Patient is currently using no method  to prevent pregnancy.     Return for as needed.  No future appointments.  Wendi Snipes, FNP

## 2021-03-09 LAB — HM HIV SCREENING LAB: HM HIV Screening: NEGATIVE

## 2021-05-02 ENCOUNTER — Emergency Department
Admission: EM | Admit: 2021-05-02 | Discharge: 2021-05-02 | Disposition: A | Discharge status: Home / Self Care | Payer: Medicaid Other | Attending: Emergency Medicine | Admitting: Emergency Medicine

## 2021-05-02 ENCOUNTER — Other Ambulatory Visit: Payer: Self-pay

## 2021-05-02 DIAGNOSIS — Z28310 Unvaccinated for covid-19: Secondary | ICD-10-CM | POA: Diagnosis not present

## 2021-05-02 DIAGNOSIS — R509 Fever, unspecified: Secondary | ICD-10-CM | POA: Diagnosis present

## 2021-05-02 DIAGNOSIS — U071 COVID-19: Secondary | ICD-10-CM | POA: Insufficient documentation

## 2021-05-02 DIAGNOSIS — Z87891 Personal history of nicotine dependence: Secondary | ICD-10-CM | POA: Diagnosis not present

## 2021-05-02 DIAGNOSIS — B349 Viral infection, unspecified: Secondary | ICD-10-CM

## 2021-05-02 LAB — COMPREHENSIVE METABOLIC PANEL
ALT: 17 U/L (ref 0–44)
AST: 18 U/L (ref 15–41)
Albumin: 3.9 g/dL (ref 3.5–5.0)
Alkaline Phosphatase: 69 U/L (ref 38–126)
Anion gap: 8 (ref 5–15)
BUN: 10 mg/dL (ref 6–20)
CO2: 23 mmol/L (ref 22–32)
Calcium: 8.7 mg/dL — ABNORMAL LOW (ref 8.9–10.3)
Chloride: 102 mmol/L (ref 98–111)
Creatinine, Ser: 0.78 mg/dL (ref 0.44–1.00)
GFR, Estimated: >60 mL/min (ref >60)
Glucose, Bld: 103 mg/dL — ABNORMAL HIGH (ref 70–99)
Potassium: 3.9 mmol/L (ref 3.5–5.1)
Sodium: 133 mmol/L — ABNORMAL LOW (ref 135–145)
Total Bilirubin: 0.6 mg/dL (ref 0.3–1.2)
Total Protein: 7.4 g/dL (ref 6.5–8.1)

## 2021-05-02 LAB — CBC WITH DIFFERENTIAL/PLATELET
Abs Immature Granulocytes: 0.02 10*3/uL (ref 0.00–0.07)
Basophils Absolute: 0 10*3/uL (ref 0.0–0.1)
Basophils Relative: 0 %
Eosinophils Absolute: 0 10*3/uL (ref 0.0–0.5)
Eosinophils Relative: 0 %
HCT: 34.7 % — ABNORMAL LOW (ref 36.0–46.0)
Hemoglobin: 11.3 g/dL — ABNORMAL LOW (ref 12.0–15.0)
Immature Granulocytes: 0 %
Lymphocytes Relative: 4 %
Lymphs Abs: 0.3 10*3/uL — ABNORMAL LOW (ref 0.7–4.0)
MCH: 27.5 pg (ref 26.0–34.0)
MCHC: 32.6 g/dL (ref 30.0–36.0)
MCV: 84.4 fL (ref 80.0–100.0)
Monocytes Absolute: 1 10*3/uL (ref 0.1–1.0)
Monocytes Relative: 14 %
Neutro Abs: 5.8 10*3/uL (ref 1.7–7.7)
Neutrophils Relative %: 82 %
Platelets: 237 10*3/uL (ref 150–400)
RBC: 4.11 MIL/uL (ref 3.87–5.11)
RDW: 14.8 % (ref 11.5–15.5)
WBC: 7.1 10*3/uL (ref 4.0–10.5)
nRBC: 0 % (ref 0.0–0.2)

## 2021-05-02 LAB — RESP PANEL BY RT-PCR (FLU A&B, COVID) ARPGX2
Influenza A by PCR: NEGATIVE
Influenza B by PCR: NEGATIVE
SARS Coronavirus 2 by RT PCR: POSITIVE — AB

## 2021-05-02 LAB — LACTIC ACID, PLASMA: Lactic Acid, Venous: 0.7 mmol/L (ref 0.5–1.9)

## 2021-05-02 MED ORDER — IBUPROFEN 800 MG PO TABS
800.0000 mg | ORAL_TABLET | Freq: Once | ORAL | Status: AC
  Administered 2021-05-02: 800 mg via ORAL
  Filled 2021-05-02: qty 1

## 2021-05-02 MED ORDER — ACETAMINOPHEN 500 MG PO TABS
1000.0000 mg | ORAL_TABLET | Freq: Once | ORAL | Status: AC
  Administered 2021-05-02: 1000 mg via ORAL
  Filled 2021-05-02: qty 2

## 2021-05-02 NOTE — ED Triage Notes (Signed)
Pt arrived via [facility] from home with reports of not feeling well since yesterday morning, pt states she has chills, fever, painful legs and right eye pain along with sensitivity to light.  Denies any sick contacts, denies any shortness of breath or chest pain.    Pt reports last dose of tylenol was around 430pm.

## 2021-05-02 NOTE — ED Notes (Signed)
Pt ambulatory to POV without difficulty. VSS. NAD. She stated that the Tylenol given to her earlier really helped her. I ask if she was able to give a urine sample. She states that she was going in the lobby but no one told her that she needed one. She no longer had to go. Pt was given a work note and all questions and concerns were addressed.

## 2021-05-02 NOTE — ED Provider Notes (Addendum)
[facility] Emergency Department Provider Note  ____________________________________________   Event Date/Time   First MD Initiated Contact with Patient 05/02/21 [phone]     (approximate)  I have reviewed the triage vital signs and the nursing notes.   HISTORY  Chief Complaint Fever, Generalized Body Aches, and Chills    HPI Peggy Shaw is a 30 y.o. female with history of migraines, obesity who presents to the emergency department 1 day of fever, nonproductive cough, chills, body aches, headache, right eye irritation. Not vaccinated for COVID or influenza.  Denies sore throat, ear pain, chest pain, shortness of breath, abdominal pain, nausea, vomiting, diarrhea, dysuria, hematuria, vaginal bleeding or discharge, rash.  Given Tylenol in triage and reports feeling better.  No known sick contacts, recent travel, tick bite.        Past Medical History:  Diagnosis Date  . Abnormal uterine bleeding   . Migraines     Patient Active Problem List   Diagnosis Date Noted  . BMI 40.0-44.9, adult (HCC) 01/30/2017    Past Surgical History:  Procedure Laterality Date  . HAND SURGERY Right 2015  . 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 Authorizing Provider  brompheniramine-pseudoephedrine-DM 30-2-10 MG/5ML syrup Take 5 mLs by mouth 4 (four) times daily as needed. Mix with 5 mL of viscous lidocaine for swish and swallow. Patient not taking: Reported on 01/06/2021 11/09/20   Joni Reining, PA-C  ibuprofen (ADVIL) 600 MG tablet Take 1 tablet (600 mg total) by mouth every 8 (eight) hours as needed. Patient not taking: Reported on 01/06/2021 11/09/20   Joni Reining, PA-C  lidocaine (XYLOCAINE) 2 % solution Use as directed 5 mLs in the mouth or throat every 6 (six) hours as needed for mouth pain. Mix with 5 mL of Bromfed-DM for swish and swallow. Patient not  taking: Reported on 01/06/2021 11/09/20   Joni Reining, PA-C  medroxyPROGESTERone (DEPO-PROVERA) 150 MG/ML injection Inject 150 mg into the muscle every 3 (three) months. Patient not taking: Reported on 01/06/2021    [provider]  Norethindrone Acetate-Ethinyl Estrad-FE (BLISOVI 24 FE) 1-20 MG-MCG(24) tablet Take 1 tablet by mouth daily. Patient not taking: No sig reported 05/30/18   Natale Milch, MD    Allergies Patient has no known allergies.  Family History  Problem Relation Age of Onset  . Diabetes Neg Hx   . Hypertension Neg Hx   . Stroke Neg Hx   . Cancer Neg Hx   . Thyroid disease Neg Hx     Social History Social History   Tobacco Use  . Smoking status: Former Smoker    Packs/day: 0.25    Types: Cigarettes    Quit date: 03/04/2018    Years since quitting: 3.1  . Smokeless tobacco: Never Used  Vaping Use  . Vaping Use: Never used  Substance Use Topics  . Alcohol use: Yes    Comment: ocasionally   . Drug use: No    Review of Systems Constitutional: +fever. Eyes: No visual changes. ENT: No sore throat. Cardiovascular: Denies chest pain. Respiratory: Denies shortness of breath. Gastrointestinal: No nausea, vomiting, diarrhea. Genitourinary: Negative for dysuria. Musculoskeletal: Negative for back pain. Skin: Negative for rash. Neurological: Negative for focal weakness or numbness.  ____________________________________________   PHYSICAL EXAM:  VITAL SIGNS: ED Triage Vitals  Enc Vitals Group     BP  05/02/21 0232 104/88     Pulse Rate 05/02/21 0232 92     Resp 05/02/21 0232 20     Temp 05/02/21 0232 (!) 103.1 F (39.5 C)     Temp Source 05/02/21 0232 Oral     SpO2 05/02/21 0232 100 %     Weight 05/02/21 0237 232 lb (105.2 kg)     Height 05/02/21 0237 5\' 4"  (1.626 m)     Head Circumference --      Peak Flow --      Pain Score 05/02/21 0237 10     Pain Loc --      Pain Edu? --      Excl. in GC? --    CONSTITUTIONAL: Alert and  oriented and responds appropriately to questions. Well-appearing; well-nourished, nontoxic, well-hydrated HEAD: Normocephalic EYES: Conjunctivae clear, pupils appear equal, EOM appear intact, no injection or drainage from either eye ENT: normal nose; moist mucous membranes; No pharyngeal erythema or petechiae, no tonsillar hypertrophy or exudate, no uvular deviation, no unilateral swelling, no trismus or drooling, no muffled voice, normal phonation, no stridor, no dental caries present, no drainable dental abscess noted, no Ludwig's angina, tongue sits flat in the bottom of the mouth, no angioedema, no facial erythema or warmth, no facial swelling; no pain with movement of the neck, no cervical LAD. NECK: Supple, normal ROM CARD: RRR; S1 and S2 appreciated; no murmurs, no clicks, no rubs, no gallops RESP: Normal chest excursion without splinting or tachypnea; breath sounds clear and equal bilaterally; no wheezes, no rhonchi, no rales, no hypoxia or respiratory distress, speaking full sentences ABD/GI: Normal bowel sounds; non-distended; soft, non-tender, no rebound, no guarding, no peritoneal signs, no hepatosplenomegaly BACK: The back appears normal EXT: Normal ROM in all joints; no deformity noted, no edema; no cyanosis, no calf tenderness or calf swelling SKIN: Normal color for age and race; warm; no rash on exposed skin NEURO: Moves all extremities equally PSYCH: The patient's mood and manner are appropriate.  ____________________________________________   LABS (all labs ordered are listed, but only abnormal results are displayed)  Labs Reviewed  RESP PANEL BY RT-PCR (FLU A&B, COVID) ARPGX2 - Abnormal; Notable for the following components:      Result Value   SARS Coronavirus 2 by RT PCR POSITIVE (*)    All other components within normal limits  COMPREHENSIVE METABOLIC PANEL - Abnormal; Notable for the following components:   Sodium 133 (*)    Glucose, Bld 103 (*)    Calcium 8.7 (*)     All other components within normal limits  CBC WITH DIFFERENTIAL/PLATELET - Abnormal; Notable for the following components:   Hemoglobin 11.3 (*)    HCT 34.7 (*)    Lymphs Abs 0.3 (*)    All other components within normal limits  LACTIC ACID, PLASMA   ____________________________________________  EKG   ____________________________________________  RADIOLOGY I, Oda Lansdowne, personally viewed and evaluated these images (plain radiographs) as part of my medical decision making, as well as reviewing the written report by the radiologist.  ED MD interpretation:    Official radiology report(s): No results found.  ____________________________________________   PROCEDURES  Procedure(s) performed (including Critical Care):  Procedures   ____________________________________________   INITIAL IMPRESSION / ASSESSMENT AND PLAN / ED COURSE  As part of my medical decision making, I reviewed the following data within the electronic MEDICAL RECORD NUMBER Nursing notes reviewed and incorporated, Labs reviewed , Old chart reviewed and Notes from prior ED visits  Patient here with symptoms of viral illness.  Well-appearing here, nontoxic and reports feeling better after Tylenol.  Doubt meningitis, pneumonia, bacteremia, sepsis.  Labs reassuring.  Normal white blood cell count, lactic.  Has had nonproductive cough but lungs are clear and there is no hypoxia or increased work of breathing.  Doubt pneumonia.  I do not feel she needs a chest x-ray at this time.  She denies any chest pain or shortness of breath.  No urinary symptoms.  Offered pregnancy testing which patient declines.  Tolerating p.o. here.  I feel she is safe to be discharged home and alternate Tylenol, Motrin.  Discussed return precautions and supportive care instructions.  We will send COVID and flu swabs but patient may follow-up on these results through MyChart.  She verbalized understanding and is comfortable with this  plan.  Provided with work note.  At this time, I do not feel there is any life-threatening condition present. I have reviewed, interpreted and discussed all results (EKG, imaging, lab, urine as appropriate) and exam findings with patient/family. I have reviewed nursing notes and appropriate previous records.  I feel the patient is safe to be discharged home without further emergent workup and can continue workup as an outpatient as needed. Discussed usual and customary return precautions. Patient/family verbalize understanding and are comfortable with this plan.  Outpatient follow-up has been provided as needed. All questions have been answered.   11:00 PM  Patient's COVID test is positive.  I have contacted her by phone to let her know of these results.  Recommended quarantining for 10 days after the onset of symptoms.  She verbalized understanding.  She is unvaccinated but no significant risk factors other than obesity and was presenting with mild disease. ____________________________________________   FINAL CLINICAL IMPRESSION(S) / ED DIAGNOSES  Final diagnoses:  Viral syndrome  COVID-19     ED Discharge Orders    None      *Please note:  KYNDELL ZEISER was evaluated in Emergency Department on 05/02/2021 for the symptoms described in the history of present illness. She was evaluated in the context of the global COVID-19 pandemic, which necessitated consideration that the patient might be at risk for infection with the SARS-CoV-2 virus that causes COVID-19. Institutional protocols and algorithms that pertain to the evaluation of patients at risk for COVID-19 are in a state of rapid change based on information released by regulatory bodies including the CDC and federal and state organizations. These policies and algorithms were followed during the patient's care in the ED.  Some ED evaluations and interventions may be delayed as a result of limited staffing during and the pandemic.*   Note:   This document was prepared using Dragon voice recognition software and may include unintentional dictation errors.   Sabriel Borromeo, Layla Maw, DO 05/02/21 1540    Lee Kalt, Layla Maw, DO 05/02/21 2304

## 2021-05-02 NOTE — Discharge Instructions (Signed)
You may alternate Tylenol 1000 mg every 6 hours as needed for pain, fever and Ibuprofen 800 mg every 8 hours as needed for pain, fever.  Please take Ibuprofen with food.  Do not take more than 4000 mg of Tylenol (acetaminophen) in a 24 hour period.  Your labs are reassuring today.  You may follow-up on your COVID and flu test through MyChart.  Please rest and drink plenty of fluids.  You do not need antibiotics today.

## 2021-05-02 NOTE — ED Triage Notes (Signed)
FIRST NURSE NOTE:  Fever, chills, weakness in legs, took tylenol yesterday morning.  Pt arrived via [facility] from home.  VS 134/80 p-95 97%RA, 99.4 ear

## 2022-02-11 ENCOUNTER — Ambulatory Visit: Payer: Medicaid Other | Admitting: Advanced Practice Midwife

## 2022-02-11 ENCOUNTER — Encounter: Payer: Self-pay | Admitting: Advanced Practice Midwife

## 2022-02-11 ENCOUNTER — Other Ambulatory Visit: Payer: Self-pay

## 2022-02-11 DIAGNOSIS — F17200 Nicotine dependence, unspecified, uncomplicated: Secondary | ICD-10-CM

## 2022-02-11 DIAGNOSIS — Z113 Encounter for screening for infections with a predominantly sexual mode of transmission: Secondary | ICD-10-CM | POA: Diagnosis not present

## 2022-02-11 LAB — WET PREP FOR TRICH, YEAST, CLUE
Trichomonas Exam: NEGATIVE
Yeast Exam: NEGATIVE

## 2022-02-11 LAB — HM HIV SCREENING LAB: HM HIV Screening: NEGATIVE

## 2022-02-11 NOTE — Progress Notes (Signed)
Pt here for STD screening.  Wet mount results reviewed, no treatment required per SO.  Pt declined condoms.  Winston Misner M Titania Gault, RN ° °

## 2022-02-11 NOTE — Progress Notes (Signed)
[facility] Department ? ?STI clinic/screening visit ?[address] ?[place] [address] ?[id number] ? ?Subjective:  ?Peggy Shaw is a 31 y.o. SBF smoker G2P2 female being seen today for an STI screening visit. The patient reports they do not have symptoms.  Patient reports that they do not desire a pregnancy in the next year.   They reported they are not interested in discussing contraception today.   ? ?Patient's last menstrual period was 01/26/2022 (exact date). ? ? ?Patient has the following medical conditions:   ?Patient Active Problem List  ? Diagnosis Date Noted  ? Smoker 5-7 cpd 02/11/2022  ? BMI 40.0-44.9, adult (HCC) 01/30/2017  ? ? ?Chief Complaint  ?Patient presents with  ? SEXUALLY TRANSMITTED DISEASE  ?  Screening  ? ? ?HPI ? ?Patient reports asymptomatic. Last sex 01/29/22 without condom; with current partner x 3 years; 2 sex partners in last 3 mo. LMP 01/26/22. Last ETOH last night (3 [place] shots) 3x/mo. Smoking 5-7 cpd.  ? ?Last HIV test per patient/review of record was 03/09/21 ?Patient reports last pap was 01/06/21 neg ? ?Screening for MPX risk: ?Does the patient have an unexplained rash? No ?Is the patient MSM? No ?Does the patient endorse multiple sex partners or anonymous sex partners? No ?Did the patient have close or sexual contact with a person diagnosed with MPX? No ?Has the patient traveled outside the [place] where MPX is endemic? No ?Is there a high clinical suspicion for MPX-- evidenced by one of the following No ? -Unlikely to be chickenpox ? -Lymphadenopathy ? -Rash that present in same phase of evolution on any given body part ?See flowsheet for further details and programmatic requirements.  ? ? ?The following portions of the patient's history were reviewed and updated as appropriate: allergies, current medications, past medical history, past social history, past surgical history and problem list. ? ?Objective:  ?There were no vitals filed for this  visit. ? ?Physical Exam ?Vitals and nursing note reviewed.  ?Constitutional:   ?   Appearance: Normal appearance. She is obese.  ?HENT:  ?   Head: Normocephalic and atraumatic.  ?   Mouth/Throat:  ?   Mouth: Mucous membranes are moist.  ?   Pharynx: Oropharynx is clear. No oropharyngeal exudate or posterior oropharyngeal erythema.  ?Eyes:  ?   Conjunctiva/sclera: Conjunctivae normal.  ?Pulmonary:  ?   Effort: Pulmonary effort is normal.  ?Abdominal:  ?   Palpations: Abdomen is soft. There is no mass.  ?   Tenderness: There is no abdominal tenderness. There is no rebound.  ?   Comments: Soft without masses or tenderness, poor tone  ?Genitourinary: ?   General: Normal vulva.  ?   Exam position: Lithotomy position.  ?   Pubic Area: No rash or pubic lice.   ?   Labia:     ?   Right: No rash or lesion.     ?   Left: No rash or lesion.   ?   Vagina: Vaginal discharge (white creamy leukorrhea, ph<4.5) present. No erythema, bleeding or lesions.  ?   Cervix: Normal.  ?   Uterus: Normal.   ?   Adnexa: Right adnexa normal and left adnexa normal.  ?   Rectum: Normal.  ?Lymphadenopathy:  ?   Head:  ?   Right side of head: No preauricular or posterior auricular adenopathy.  ?   Left side of head: No preauricular or posterior auricular adenopathy.  ?  Cervical: No cervical adenopathy.  ?   Right cervical: No superficial, deep or posterior cervical adenopathy. ?   Left cervical: No superficial, deep or posterior cervical adenopathy.  ?   Upper Body:  ?   Right upper body: No supraclavicular or axillary adenopathy.  ?   Left upper body: No supraclavicular or axillary adenopathy.  ?   Lower Body: No right inguinal adenopathy. No left inguinal adenopathy.  ?Skin: ?   General: Skin is warm and dry.  ?   Findings: No rash.  ?Neurological:  ?   Mental Status: She is alert and oriented to person, place, and time.  ? ? ? ?Assessment and Plan:  ?[name] is a 31 y.o. female presenting to the [facility] Department for STI  screening ? ?1. Screening examination for venereal disease ?Treat wet mount per standing orders ?Immunization nurse consult ?- WET PREP FOR TRICH, YEAST, CLUE ?- Chlamydia/Gonorrhea [place] [facility] ?- HIV [place] [facility] ?- Syphilis Serology, [place] [facility] ?- Gonococcus culture ? ?2. Smoker 5-7 cpd ?Please give pt 1800# ? ? ? ? ?No follow-ups on file. ? ?No future appointments. ? ?Alberteen Spindle, CNM ?

## 2022-02-16 LAB — GONOCOCCUS CULTURE

## 2022-06-09 ENCOUNTER — Ambulatory Visit: Payer: Medicaid Other | Admitting: Family Medicine

## 2022-06-09 ENCOUNTER — Encounter: Payer: Self-pay | Admitting: Family Medicine

## 2022-06-09 DIAGNOSIS — Z7251 High risk heterosexual behavior: Secondary | ICD-10-CM

## 2022-06-09 DIAGNOSIS — Z113 Encounter for screening for infections with a predominantly sexual mode of transmission: Secondary | ICD-10-CM | POA: Diagnosis not present

## 2022-06-09 DIAGNOSIS — N926 Irregular menstruation, unspecified: Secondary | ICD-10-CM

## 2022-06-09 LAB — PREGNANCY, URINE: Preg Test, Ur: NEGATIVE

## 2022-06-09 LAB — WET PREP FOR TRICH, YEAST, CLUE
Trichomonas Exam: NEGATIVE
Yeast Exam: NEGATIVE

## 2022-06-09 LAB — HM HIV SCREENING LAB: HM HIV Screening: NEGATIVE

## 2022-06-09 NOTE — Progress Notes (Signed)
[facility] Department  STI clinic/screening visit [address] [place] [address] [phone]  Subjective:  Peggy Shaw is a 31 y.o. [phone] female being seen today for an STI screening visit. The patient reports they do not have symptoms.  Patient reports that they do not desire a pregnancy in the next year.   They reported they are not interested in discussing contraception today.    Patient's last menstrual period was 05/12/2022 (exact date).   Patient has the following medical conditions:   Patient Active Problem List   Diagnosis Date Noted   Smoker 5-7 cpd 02/11/2022   BMI 40.0-44.9, adult (HCC) 01/30/2017    Chief Complaint  Patient presents with   SEXUALLY TRANSMITTED DISEASE    Screening    HPI  Patient reports worried about missing her cycle. She is usually regular but missed this month. Reports unprotected sex. in the past 2 weeks.   Last HIV test per patient/review of record was 01/2022 Patient reports last pap was 2022.   Screening for MPX risk: Does the patient have an unexplained rash? No Is the patient MSM? No Does the patient endorse multiple sex partners or anonymous sex partners? No Did the patient have close or sexual contact with a person diagnosed with MPX? No Has the patient traveled outside the [place] where MPX is endemic? No Is there a high clinical suspicion for MPX-- evidenced by one of the following No  -Unlikely to be chickenpox  -Lymphadenopathy  -Rash that present in same phase of evolution on any given body part See flowsheet for further details and programmatic requirements.   Immunization history:  Immunization History  Administered Date(s) Administered   Hepatitis B 12/31/1991, 12/09/1992, 08/29/2003   Hpv-Unspecified 01/26/2009, 01/27/2010, 06/02/2010   Tdap 04/14/2017     The following portions of the patient's history were reviewed and updated as appropriate: allergies, current medications, past  medical history, past social history, past surgical history and problem list.  Objective:  There were no vitals filed for this visit.  Physical Exam Vitals and nursing note reviewed.  Constitutional:      Appearance: Normal appearance.  HENT:     Head: Normocephalic and atraumatic.     Mouth/Throat:     Mouth: Mucous membranes are moist.     Pharynx: Oropharynx is clear. No oropharyngeal exudate or posterior oropharyngeal erythema.  Pulmonary:     Effort: Pulmonary effort is normal.  Abdominal:     General: Abdomen is flat.     Palpations: There is no mass.     Tenderness: There is no abdominal tenderness. There is no rebound.  Genitourinary:    General: Normal vulva.     Exam position: Lithotomy position.     Pubic Area: No rash or pubic lice.      Labia:        Right: No rash or lesion.        Left: No rash or lesion.      Vagina: Normal. No vaginal discharge, erythema, bleeding or lesions.     Cervix: No cervical motion tenderness, discharge, friability, lesion or erythema.     Uterus: Normal.      Adnexa: Right adnexa normal and left adnexa normal.     Rectum: Normal.     Comments: pH = <4.5 Lymphadenopathy:     Head:     Right side of head: No preauricular or posterior auricular adenopathy.     Left side of head: No preauricular  or posterior auricular adenopathy.     Cervical: No cervical adenopathy.     Upper Body:     Right upper body: No supraclavicular, axillary or epitrochlear adenopathy.     Left upper body: No supraclavicular, axillary or epitrochlear adenopathy.     Lower Body: No right inguinal adenopathy. No left inguinal adenopathy.  Skin:    General: Skin is warm and dry.     Findings: No rash.  Neurological:     Mental Status: She is alert and oriented to person, place, and time.      Assessment and Plan:  Peggy Shaw is a 31 y.o. female presenting to the [facility] Department for STI screening 1. Screening examination for  venereal disease Patient accepted all screenings including vaginal CT/GC and bloodwork for HIV/RPR.  Patient meets criteria for HepB screening? No. Ordered? No - NA Patient meets criteria for HepC screening? No. Ordered? No-NA  Treat wet prep per standing order Discussed time line for State Lab results and that patient will be called with positive results and encouraged patient to call if she had not heard in 2 weeks.  Counseled to return or seek care for continued or worsening symptoms Recommended condom use with all sex  Patient is having regular unprotected sex but does not want a pregnancy.   - Chlamydia/Gonorrhea [facility] - HIV [facility] - Syphilis Serology, [facility] - WET PREP FOR TRICH, YEAST, CLUE  2. Missed period UPT negative   3. Unprotected sex - Pregnancy, urine    Return if symptoms worsen or fail to improve.  No future appointments.  Federico Flake, MD

## 2022-06-09 NOTE — Progress Notes (Signed)
WET PREP negative. UPT negative. No treatment indicated in clinic today. Pt verbalized understanding of further labwork pending.  Lethea Killings RN

## 2022-07-09 ENCOUNTER — Ambulatory Visit
Admission: EM | Admit: 2022-07-09 | Discharge: 2022-07-09 | Disposition: A | Discharge status: Home / Self Care | Payer: Medicaid Other | Attending: Family Medicine | Admitting: Family Medicine

## 2022-07-09 DIAGNOSIS — Z3A08 8 weeks gestation of pregnancy: Secondary | ICD-10-CM

## 2022-07-09 DIAGNOSIS — O9A211 Injury, poisoning and certain other consequences of external causes complicating pregnancy, first trimester: Secondary | ICD-10-CM | POA: Diagnosis not present

## 2022-07-09 DIAGNOSIS — R109 Unspecified abdominal pain: Secondary | ICD-10-CM | POA: Diagnosis not present

## 2022-07-09 DIAGNOSIS — Z3490 Encounter for supervision of normal pregnancy, unspecified, unspecified trimester: Secondary | ICD-10-CM
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
## 2022-07-09 NOTE — ED Notes (Signed)
Patient is being discharged from the Urgent Care and sent to the [facility] Emergency Department via private vehicle . Per Dr. Rachael Darby, patient is in need of higher level of care due to current pregnancy. Patient is aware and verbalizes understanding of plan of care.  Vitals:   07/09/22 1325  BP: 98/73  Pulse: 63  Temp: 98.7 F (37.1 C)  SpO2: 100%
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
## 2022-07-13 ENCOUNTER — Ambulatory Visit (INDEPENDENT_AMBULATORY_CARE_PROVIDER_SITE_OTHER): Payer: Medicaid Other | Admitting: Obstetrics & Gynecology

## 2022-07-13 ENCOUNTER — Encounter: Payer: Self-pay | Admitting: Obstetrics & Gynecology

## 2022-07-13 VITALS — BP 120/80 | HR 66 | Ht 64.0 in | Wt 241.0 lb

## 2022-07-13 DIAGNOSIS — N926 Irregular menstruation, unspecified: Secondary | ICD-10-CM | POA: Diagnosis not present

## 2022-07-13 DIAGNOSIS — N912 Amenorrhea, unspecified: Secondary | ICD-10-CM

## 2022-07-13 LAB — POCT URINE PREGNANCY: Preg Test, Ur: POSITIVE — AB

## 2022-07-22 ENCOUNTER — Ambulatory Visit: Payer: Medicaid Other

## 2022-07-29 ENCOUNTER — Other Ambulatory Visit: Payer: Medicaid Other

## 2022-08-04 ENCOUNTER — Encounter: Payer: Self-pay | Admitting: Obstetrics & Gynecology

## 2022-08-04 NOTE — Progress Notes (Signed)
Subjective:    Peggy Shaw is a 31 y.o. [phone], female who presents for evaluation of missed period. She believes she could be pregnant. Pregnancy is desired. Sexual Activity: one female partner . Current symptoms also include:  no complaints . Last period was 05/12/2022.   Patient's last menstrual period was 05/12/2022. The following portions of the patient's history were reviewed and updated as appropriate: allergies, current medications, past family history, past medical history, past social history, past surgical history, and problem list.  Review of Systems A comprehensive review of systems was negative.     Objective:    BP 120/80 (BP Location: Left Arm, Patient Position: Sitting, Cuff Size: Large)   Pulse 66   Ht 5\' 4"  (1.626 m)   Wt 241 lb (109.3 kg)   LMP 05/12/2022   SpO2 99%   BMI 41.37 kg/m  General: alert, cooperative, no distress, and no acute distress    Lab Review Urine HCG: positive    Assessment:    Absence of menstruation.     Plan:    Pregnancy Test:  positive  Register for prenatal care  05/14/2022, MD  08/04/2022 3:10 AM

## 2022-08-05 ENCOUNTER — Encounter: Payer: Medicaid Other | Admitting: Obstetrics

## 2022-08-08 ENCOUNTER — Ambulatory Visit (INDEPENDENT_AMBULATORY_CARE_PROVIDER_SITE_OTHER): Payer: Medicaid Other

## 2022-08-08 VITALS — Wt 248.0 lb

## 2022-08-08 DIAGNOSIS — Z3480 Encounter for supervision of other normal pregnancy, unspecified trimester: Secondary | ICD-10-CM

## 2022-08-08 DIAGNOSIS — O0990 Supervision of high risk pregnancy, unspecified, unspecified trimester: Secondary | ICD-10-CM | POA: Insufficient documentation

## 2022-08-08 DIAGNOSIS — Z8639 Personal history of other endocrine, nutritional and metabolic disease: Secondary | ICD-10-CM

## 2022-08-08 DIAGNOSIS — Z369 Encounter for antenatal screening, unspecified: Secondary | ICD-10-CM

## 2022-08-08 DIAGNOSIS — O0993 Supervision of high risk pregnancy, unspecified, third trimester: Secondary | ICD-10-CM | POA: Insufficient documentation

## 2022-08-08 NOTE — Progress Notes (Signed)
Contacted pt to give the number for Centralized Scheduling so that she can schedule her dating scan bf her 9/18 appt with Dr. Marice Potter,

## 2022-08-08 NOTE — Progress Notes (Signed)
New OB Intake  I connected with  Peggy Shaw on 08/08/22 at  3:15 PM EDT by telephone and verified that I am speaking with the correct person using two identifiers. Nurse is located at [facility] and pt is located at work.  I explained I am completing New OB Intake today. We discussed her EDD of 02/16/2023 that is based on LMP of 05/12/2022. Pt is G4/P2012. I reviewed her allergies, medications, Medical/Surgical/OB history, and appropriate screenings. Based on history, this is a/an pregnancy uncomplicated .   Patient Active Problem List   Diagnosis Date Noted   Supervision of other normal pregnancy, antepartum 08/08/2022   Smoker 5-7 cpd 02/11/2022   BMI 40.0-44.9, adult (HCC) 01/30/2017    Concerns addressed today None  Delivery Plans:  Plans to deliver at [facility].  Anatomy US Explained she will be called to schedule dating/viability u/s and will have an anatomy scan at 20 weeks.  Labs Discussed genetic screening with patient. Patient desires genetic testing to be drawn with new OB labs.  Discussed possible labs to be drawn at new OB appointment.  COVID Vaccine Patient has not had COVID vaccine.   Social Determinants of Health Food Insecurity: expresses food insecurity. Information given on local food banks. Transportation: Patient denies transportation needs. Childcare: Discussed no children allowed at ultrasound appointments.   First visit review I reviewed new OB appt with pt. I explained she will have ob bloodwork and pap smear/pelvic exam if indicated. Explained pt will be seen by Dr. Nicholaus Bloom at first visit; encounter routed to appropriate provider.   Loran Senters, [facility] 08/08/2022  3:38 PM  Clinical Staff Provider  Office Location  [facility] Dating    Language  English Anatomy US    Flu Vaccine  offer Genetic Screen  NIPS:   TDaP vaccine   offer Hgb A1C or  GTT Early : Third trimester :   Covid declined   LAB RESULTS   Rhogam    Blood Type     Feeding Plan Will try Antibody    Contraception undecided Rubella    Circumcision yes RPR     Pediatrician  [facility]     Support Person Greg HIV    Prenatal Classes no Varicella     GBS  (For PCN allergy, check sensitivities)   BTL Consent  Hep C   VBAC Consent  Pap      Hgb Electro      CF      SMA

## 2022-08-11 ENCOUNTER — Other Ambulatory Visit: Payer: Self-pay | Admitting: Obstetrics & Gynecology

## 2022-08-11 ENCOUNTER — Ambulatory Visit
Admission: RE | Admit: 2022-08-11 | Discharge: 2022-08-11 | Disposition: A | Discharge status: Home / Self Care | Payer: Medicaid Other | Source: Ambulatory Visit | Attending: Obstetrics & Gynecology | Admitting: Obstetrics & Gynecology

## 2022-08-11 DIAGNOSIS — Z3480 Encounter for supervision of other normal pregnancy, unspecified trimester: Secondary | ICD-10-CM | POA: Diagnosis present

## 2022-08-11 DIAGNOSIS — Z3481 Encounter for supervision of other normal pregnancy, first trimester: Secondary | ICD-10-CM | POA: Diagnosis not present

## 2022-08-11 DIAGNOSIS — Z3A12 12 weeks gestation of pregnancy: Secondary | ICD-10-CM | POA: Insufficient documentation

## 2022-08-11 DIAGNOSIS — Z369 Encounter for antenatal screening, unspecified: Secondary | ICD-10-CM | POA: Insufficient documentation

## 2022-08-12 ENCOUNTER — Other Ambulatory Visit: Payer: BLUE CROSS/BLUE SHIELD

## 2022-08-12 DIAGNOSIS — Z369 Encounter for antenatal screening, unspecified: Secondary | ICD-10-CM

## 2022-08-12 DIAGNOSIS — Z3480 Encounter for supervision of other normal pregnancy, unspecified trimester: Secondary | ICD-10-CM

## 2022-08-13 LAB — CBC/D/PLT+RPR+RH+ABO+RUBIGG...
Antibody Screen: NEGATIVE
Basophils Absolute: 0 10*3/uL (ref 0.0–0.2)
Basos: 1 %
EOS (ABSOLUTE): 0.1 10*3/uL (ref 0.0–0.4)
Eos: 1 %
HCV Ab: NONREACTIVE
HIV Screen 4th Generation wRfx: NONREACTIVE
Hematocrit: 32.3 % — ABNORMAL LOW (ref 34.0–46.6)
Hemoglobin: 10.2 g/dL — ABNORMAL LOW (ref 11.1–15.9)
Hepatitis B Surface Ag: NEGATIVE
Immature Grans (Abs): 0 10*3/uL (ref 0.0–0.1)
Immature Granulocytes: 0 %
Lymphocytes Absolute: 1.6 10*3/uL (ref 0.7–3.1)
Lymphs: 27 %
MCH: 27.7 pg (ref 26.6–33.0)
MCHC: 31.6 g/dL (ref 31.5–35.7)
MCV: 88 fL (ref 79–97)
Monocytes Absolute: 0.4 10*3/uL (ref 0.1–0.9)
Monocytes: 6 %
Neutrophils Absolute: 3.8 10*3/uL (ref 1.4–7.0)
Neutrophils: 65 %
Platelets: 250 10*3/uL (ref 150–450)
RBC: 3.68 x10E6/uL — ABNORMAL LOW (ref 3.77–5.28)
RDW: 13.8 % (ref 11.7–15.4)
RPR Ser Ql: NONREACTIVE
Rh Factor: POSITIVE
Rubella Antibodies, IGG: <0.9 index — ABNORMAL LOW (ref >0.99)
Varicella zoster IgG: 285 index (ref >165)
WBC: 5.8 10*3/uL (ref 3.4–10.8)

## 2022-08-13 LAB — HCV INTERPRETATION

## 2022-08-15 ENCOUNTER — Encounter: Payer: Self-pay | Admitting: Obstetrics & Gynecology

## 2022-08-15 ENCOUNTER — Other Ambulatory Visit (HOSPITAL_COMMUNITY): Payer: Self-pay | Admitting: Obstetrics & Gynecology

## 2022-08-15 ENCOUNTER — Ambulatory Visit (INDEPENDENT_AMBULATORY_CARE_PROVIDER_SITE_OTHER): Payer: Medicaid Other | Admitting: Obstetrics & Gynecology

## 2022-08-15 VITALS — BP 120/70 | Wt 243.0 lb

## 2022-08-15 DIAGNOSIS — O0991 Supervision of high risk pregnancy, unspecified, first trimester: Secondary | ICD-10-CM

## 2022-08-15 DIAGNOSIS — O99210 Obesity complicating pregnancy, unspecified trimester: Secondary | ICD-10-CM

## 2022-08-15 LAB — POCT URINALYSIS DIPSTICK OB
Glucose, UA: NEGATIVE
POC,PROTEIN,UA: NEGATIVE

## 2022-08-15 MED ORDER — FERROUS SULFATE 325 (65 FE) MG PO TABS: 325.0000 mg | ORAL_TABLET | Freq: Every day | ORAL | 1 refills | Status: DC

## 2022-08-15 NOTE — Progress Notes (Signed)
  Subjective:    Peggy Shaw is a single G25P2 (49 and 31 yo kids) being seen today for her first obstetrical visit.  This is not a planned pregnancy. She is at [redacted]w[redacted]d gestation. Her obstetrical history is significant for obesity. Relationship with FOB: significant other, living together. Patient  is not sure about breastfeeding. She tried with her second child but didn't do well with that. Pregnancy history fully reviewed.  Patient reports  breast tingling of right breast . She has had some nausea and vomitting.  Review of Systems:   Review of Systems She works in the [employer]. She says that she may not want more pregnancies after this one, does not want a BTL.  Objective:     BP 120/70   Wt 243 lb (110.2 kg)   LMP 05/12/2022 (Exact Date)   BMI 41.71 kg/m  Physical Exam  Exam Well nourished, well hydrated Black female, no apparent distress She is ambulating and conversing normally. General:  alert   Breasts:  inspection negative, no nipple discharge or bleeding, no masses or nodularity palpable  Lungs: clear to auscultation bilaterally  Heart:  regular rate and rhythm, S1, S2 normal, no murmur, click, rub or gallop  Abdomen: soft, non-tender; bowel sounds normal; no masses,  no organomegaly         Assessment:    Pregnancy: [id number] Patient Active Problem List   Diagnosis Date Noted   Supervision of other normal pregnancy, antepartum 08/08/2022   Smoker 5-7 cpd 02/11/2022   Anemia in pregnancy 03/13/2017   BMI 40.0-44.9, adult ([place]) 01/30/2017       Plan:    Prenatal vitamins. Problem list reviewed and updated. Mat 21 drawn today Discussed rec weight gain of less than 20 pounds, offered nutrition counseling Iron prescribed Role of ultrasound in pregnancy discussed; fetal survey: ordered. Amniocentesis discussed: not indicated. Follow up in 4 weeks. Check HBA1C Baseline Pre E labs drawn  Emily Filbert 08/15/2022

## 2022-08-16 ENCOUNTER — Encounter: Payer: Self-pay | Admitting: Obstetrics & Gynecology

## 2022-08-16 DIAGNOSIS — O99810 Abnormal glucose complicating pregnancy: Secondary | ICD-10-CM | POA: Insufficient documentation

## 2022-08-16 LAB — COMPREHENSIVE METABOLIC PANEL
ALT: 9 IU/L (ref 0–32)
AST: 10 IU/L (ref 0–40)
Albumin/Globulin Ratio: 1.3 (ref 1.2–2.2)
Albumin: 3.4 g/dL — ABNORMAL LOW (ref 4.0–5.0)
Alkaline Phosphatase: 70 IU/L (ref 44–121)
BUN/Creatinine Ratio: 13 (ref 9–23)
BUN: 7 mg/dL (ref 6–20)
Bilirubin Total: <0.2 mg/dL (ref 0.0–1.2)
CO2: 23 mmol/L (ref 20–29)
Calcium: 9.1 mg/dL (ref 8.7–10.2)
Chloride: 103 mmol/L (ref 96–106)
Creatinine, Ser: 0.56 mg/dL — ABNORMAL LOW (ref 0.57–1.00)
Globulin, Total: 2.6 g/dL (ref 1.5–4.5)
Glucose: 82 mg/dL (ref 70–99)
Potassium: 3.5 mmol/L (ref 3.5–5.2)
Sodium: 139 mmol/L (ref 134–144)
Total Protein: 6 g/dL (ref 6.0–8.5)
eGFR: 126 mL/min/{1.73_m2} (ref >59)

## 2022-08-16 LAB — T4, FREE: Free T4: 0.81 ng/dL — ABNORMAL LOW (ref 0.82–1.77)

## 2022-08-16 LAB — PROTEIN / CREATININE RATIO, URINE
Creatinine, Urine: 261.3 mg/dL
Protein, Ur: 18.6 mg/dL
Protein/Creat Ratio: 71 mg/g creat (ref 0–200)

## 2022-08-16 LAB — TSH: TSH: 1.78 u[IU]/mL (ref 0.450–4.500)

## 2022-08-16 LAB — HEMOGLOBIN A1C
Est. average glucose Bld gHb Est-mCnc: 120 mg/dL
Hgb A1c MFr Bld: 5.8 % — ABNORMAL HIGH (ref 4.8–5.6)

## 2022-08-17 LAB — MATERNIT 21 PLUS CORE, BLOOD
Fetal Fraction: 6
Result (T21): NEGATIVE
Trisomy 13 (Patau syndrome): NEGATIVE
Trisomy 18 (Edwards syndrome): NEGATIVE
Trisomy 21 (Down syndrome): NEGATIVE

## 2022-08-17 LAB — URINE CULTURE

## 2022-08-18 ENCOUNTER — Other Ambulatory Visit: Payer: Medicaid Other

## 2022-08-18 ENCOUNTER — Other Ambulatory Visit: Payer: Self-pay

## 2022-08-18 DIAGNOSIS — Z131 Encounter for screening for diabetes mellitus: Secondary | ICD-10-CM

## 2022-08-19 LAB — GLUCOSE, 1 HOUR GESTATIONAL: Gestational Diabetes Screen: 82 mg/dL (ref 70–139)

## 2022-08-22 ENCOUNTER — Telehealth: Payer: Self-pay | Admitting: Obstetrics & Gynecology

## 2022-08-22 LAB — MATERNIT 21 PLUS CORE, BLOOD

## 2022-08-22 NOTE — Telephone Encounter (Signed)
Patient is scheduled to see CJE on 09/12/22 at 8:15. Left message for patient to call office back to r/s

## 2022-09-01 NOTE — Telephone Encounter (Signed)
Patient is scheduled for 10/23 with LMD

## 2022-09-12 ENCOUNTER — Encounter: Payer: BLUE CROSS/BLUE SHIELD | Admitting: Obstetrics & Gynecology

## 2022-09-13 ENCOUNTER — Ambulatory Visit: Admission: RE | Admit: 2022-09-13 | Payer: Medicaid Other | Source: Ambulatory Visit

## 2022-09-19 ENCOUNTER — Encounter: Payer: Self-pay | Admitting: Licensed Practical Nurse

## 2022-09-19 ENCOUNTER — Ambulatory Visit (INDEPENDENT_AMBULATORY_CARE_PROVIDER_SITE_OTHER): Payer: Medicaid Other | Admitting: Licensed Practical Nurse

## 2022-09-19 VITALS — BP 112/52 | Wt 243.6 lb

## 2022-09-19 DIAGNOSIS — R7309 Other abnormal glucose: Secondary | ICD-10-CM

## 2022-09-19 DIAGNOSIS — F17210 Nicotine dependence, cigarettes, uncomplicated: Secondary | ICD-10-CM

## 2022-09-19 DIAGNOSIS — O26892 Other specified pregnancy related conditions, second trimester: Secondary | ICD-10-CM

## 2022-09-19 DIAGNOSIS — O99012 Anemia complicating pregnancy, second trimester: Secondary | ICD-10-CM

## 2022-09-19 DIAGNOSIS — O0990 Supervision of high risk pregnancy, unspecified, unspecified trimester: Secondary | ICD-10-CM

## 2022-09-19 DIAGNOSIS — O99332 Smoking (tobacco) complicating pregnancy, second trimester: Secondary | ICD-10-CM

## 2022-09-19 DIAGNOSIS — D649 Anemia, unspecified: Secondary | ICD-10-CM

## 2022-09-19 DIAGNOSIS — Z3A18 18 weeks gestation of pregnancy: Secondary | ICD-10-CM

## 2022-09-19 LAB — POCT URINALYSIS DIPSTICK
Glucose, UA: NEGATIVE
Leukocytes, UA: NEGATIVE
Protein, UA: NEGATIVE
Spec Grav, UA: 1.01 (ref 1.010–1.025)
Urobilinogen, UA: 1 E.U./dL
pH, UA: 6 (ref 5.0–8.0)

## 2022-09-19 NOTE — Progress Notes (Signed)
Routine Prenatal Care Visit  Subjective  Peggy Shaw is a 31 y.o. [phone] at [redacted]w[redacted]d being seen today for ongoing prenatal care.  She is currently monitored for the following issues for this high-risk pregnancy and has BMI 40.0-44.9, adult ([place]); Anemia in pregnancy; Smoker 5-7 cpd; Supervision of other normal pregnancy, antepartum; and Abnormal glucose affecting pregnancy on their problem list.  ----------------------------------------------------------------------------------- Patient reports no complaints.  Needs refill on gummy PNV Has US anatomy US scheduled at end of Nov around 23 wks, will try to have done closer to 20 weeks  Contractions: Not present. Vag. Bleeding: None.  Movement: Present. Leaking Fluid denies.  ----------------------------------------------------------------------------------- The following portions of the patient's history were reviewed and updated as appropriate: allergies, current medications, past family history, past medical history, past social history, past surgical history and problem list. Problem list updated.  Objective  Blood pressure (!) 112/52, weight 243 lb 9.6 oz (110.5 kg), last menstrual period 05/12/2022. Pregravid weight 236 lb (107 kg) Total Weight Gain 7 lb 9.6 oz (3.447 kg) Urinalysis: Urine Protein    Urine Glucose    Fetal Status: Fetal Heart Rate (bpm): 148   Movement: Present     General:  Alert, oriented and cooperative. Patient is in no acute distress.  Skin: Skin is warm and dry. No rash noted.   Cardiovascular: Normal heart rate noted  Respiratory: Normal respiratory effort, no problems with respiration noted  Abdomen: Soft, gravid, appropriate for gestational age. Pain/Pressure: Absent     Pelvic:  Cervical exam deferred        Extremities: Normal range of motion.     Mental Status: Normal mood and affect. Normal behavior. Normal judgment and thought content.   Assessment   31 y.o. [id number] at [redacted]w[redacted]d by  02/16/2023, by Last  Menstrual Period presenting for routine prenatal visit  Plan   fourth Problems (from 08/08/22 to present)     Problem Noted Resolved   Abnormal glucose affecting pregnancy 08/16/2022 by Emily Filbert, MD No        Preterm labor symptoms and general obstetric precautions including but not limited to vaginal bleeding, contractions, leaking of fluid and fetal movement were reviewed in detail with the patient. Please refer to After Visit Summary for other counseling recommendations.   Return in about 4 weeks (around 10/17/2022) for ROB.  Roberto Scales, [facility]  09/20/22  11:53 AM

## 2022-09-19 NOTE — Progress Notes (Signed)
ROB. Patient states she would like to discuss changing to gummy prenatal vitamins, and states that she would like to discuss fetal movement. Patient reports that she has begun to experience slight flutters.

## 2022-09-20 ENCOUNTER — Other Ambulatory Visit: Payer: Self-pay | Admitting: Licensed Practical Nurse

## 2022-09-20 DIAGNOSIS — O0990 Supervision of high risk pregnancy, unspecified, unspecified trimester: Secondary | ICD-10-CM

## 2022-09-20 MED ORDER — PRENATAL GUMMIES 0.18-25 MG PO CHEW: 1.0000 | CHEWABLE_TABLET | Freq: Every day | ORAL | 5 refills | Status: DC

## 2022-09-28 ENCOUNTER — Other Ambulatory Visit: Payer: Medicaid Other

## 2022-10-05 ENCOUNTER — Ambulatory Visit
Admission: RE | Admit: 2022-10-05 | Discharge: 2022-10-05 | Disposition: A | Discharge status: Home / Self Care | Payer: Medicaid Other | Source: Ambulatory Visit | Attending: Obstetrics & Gynecology | Admitting: Obstetrics & Gynecology

## 2022-10-05 DIAGNOSIS — O0991 Supervision of high risk pregnancy, unspecified, first trimester: Secondary | ICD-10-CM | POA: Diagnosis present

## 2022-10-17 ENCOUNTER — Other Ambulatory Visit: Payer: Medicaid Other

## 2022-10-17 ENCOUNTER — Ambulatory Visit (INDEPENDENT_AMBULATORY_CARE_PROVIDER_SITE_OTHER): Payer: Medicaid Other | Admitting: Obstetrics

## 2022-10-17 VITALS — BP 106/61 | HR 78 | Wt 248.0 lb

## 2022-10-17 DIAGNOSIS — O0992 Supervision of high risk pregnancy, unspecified, second trimester: Secondary | ICD-10-CM

## 2022-10-17 DIAGNOSIS — Z3A22 22 weeks gestation of pregnancy: Secondary | ICD-10-CM

## 2022-10-17 LAB — POCT URINALYSIS DIPSTICK OB
Bilirubin, UA: NEGATIVE
Blood, UA: NEGATIVE
Glucose, UA: NEGATIVE
Ketones, UA: NEGATIVE
Leukocytes, UA: NEGATIVE
Nitrite, UA: NEGATIVE
POC,PROTEIN,UA: NEGATIVE
Spec Grav, UA: 1.015 (ref 1.010–1.025)
Urobilinogen, UA: 0.2 E.U./dL
pH, UA: 7 (ref 5.0–8.0)

## 2022-10-17 NOTE — Progress Notes (Signed)
ROB at [redacted]w[redacted]d. Only feeling little flutters so far. Denies cramping or BH. Peggy Shaw is still having some nausea. She is able to take her PNV. Discussed comfort measures for hip pain. Reviewed anatomy US; inadequate views of facial profile. Will order repeat scan. 1-hour glucose, CBC, RPR at next visit. RTC in 4 weeks.  Guadlupe Spanish, CNM

## 2022-10-27 ENCOUNTER — Other Ambulatory Visit: Payer: Self-pay

## 2022-10-27 ENCOUNTER — Other Ambulatory Visit: Payer: Medicaid Other

## 2022-10-27 ENCOUNTER — Ambulatory Visit (INDEPENDENT_AMBULATORY_CARE_PROVIDER_SITE_OTHER): Payer: Medicaid Other

## 2022-10-27 DIAGNOSIS — Z362 Encounter for other antenatal screening follow-up: Secondary | ICD-10-CM | POA: Diagnosis not present

## 2022-10-27 DIAGNOSIS — Z3A24 24 weeks gestation of pregnancy: Secondary | ICD-10-CM

## 2022-10-27 DIAGNOSIS — Z3A22 22 weeks gestation of pregnancy: Secondary | ICD-10-CM

## 2022-10-28 ENCOUNTER — Encounter: Payer: Self-pay | Admitting: Obstetrics

## 2022-10-28 ENCOUNTER — Other Ambulatory Visit: Payer: Self-pay | Admitting: Obstetrics

## 2022-10-28 LAB — CBC
Hematocrit: 29.3 % — ABNORMAL LOW (ref 34.0–46.6)
Hemoglobin: 9.7 g/dL — ABNORMAL LOW (ref 11.1–15.9)
MCH: 28.2 pg (ref 26.6–33.0)
MCHC: 33.1 g/dL (ref 31.5–35.7)
MCV: 85 fL (ref 79–97)
Platelets: 245 10*3/uL (ref 150–450)
RBC: 3.44 x10E6/uL — ABNORMAL LOW (ref 3.77–5.28)
RDW: 12.6 % (ref 11.7–15.4)
WBC: 7.2 10*3/uL (ref 3.4–10.8)

## 2022-10-28 LAB — GLUCOSE, 1 HOUR GESTATIONAL: Gestational Diabetes Screen: 109 mg/dL (ref 70–139)

## 2022-10-28 LAB — RPR: RPR Ser Ql: NONREACTIVE

## 2022-10-29 ENCOUNTER — Other Ambulatory Visit: Payer: Self-pay | Admitting: Obstetrics

## 2022-10-29 MED ORDER — FUSION PLUS PO CAPS: 1.0000 | ORAL_CAPSULE | Freq: Every day | ORAL | 6 refills | Status: DC

## 2022-11-08 ENCOUNTER — Encounter: Payer: Self-pay | Admitting: Obstetrics

## 2022-11-14 ENCOUNTER — Ambulatory Visit (INDEPENDENT_AMBULATORY_CARE_PROVIDER_SITE_OTHER): Payer: Medicaid Other | Admitting: Obstetrics & Gynecology

## 2022-11-14 VITALS — BP 107/60 | HR 77 | Wt 247.0 lb

## 2022-11-14 DIAGNOSIS — Z3480 Encounter for supervision of other normal pregnancy, unspecified trimester: Secondary | ICD-10-CM

## 2022-11-14 DIAGNOSIS — Z3A26 26 weeks gestation of pregnancy: Secondary | ICD-10-CM

## 2022-11-14 DIAGNOSIS — D649 Anemia, unspecified: Secondary | ICD-10-CM

## 2022-11-14 DIAGNOSIS — O99012 Anemia complicating pregnancy, second trimester: Secondary | ICD-10-CM

## 2022-11-14 DIAGNOSIS — Z6841 Body Mass Index (BMI) 40.0 and over, adult: Secondary | ICD-10-CM

## 2022-11-14 DIAGNOSIS — O99810 Abnormal glucose complicating pregnancy: Secondary | ICD-10-CM

## 2022-11-14 LAB — POCT URINALYSIS DIPSTICK OB
Bilirubin, UA: NEGATIVE
Blood, UA: NEGATIVE
Glucose, UA: NEGATIVE
Ketones, UA: NEGATIVE
Leukocytes, UA: NEGATIVE
Nitrite, UA: NEGATIVE
POC,PROTEIN,UA: NEGATIVE
Spec Grav, UA: 1.015 (ref 1.010–1.025)
Urobilinogen, UA: 1 E.U./dL
pH, UA: 7 (ref 5.0–8.0)

## 2022-11-14 NOTE — Progress Notes (Signed)
   PRENATAL VISIT NOTE  Subjective:  Peggy Shaw is a 31 y.o. [phone] at [redacted]w[redacted]d being seen today for ongoing prenatal care.  She is currently monitored for the following issues for this high-risk pregnancy and has BMI 40.0-44.9, adult (HCC); Anemia in pregnancy; Smoker 5-7 cpd; Supervision of other normal pregnancy, antepartum; and Abnormal glucose affecting pregnancy on their problem list.  Patient reports no complaints.   . Vag. Bleeding: None.   . Denies leaking of fluid. She reports good FM.  The following portions of the patient's history were reviewed and updated as appropriate: allergies, current medications, past family history, past medical history, past social history, past surgical history and problem list.   Objective:   Vitals:   11/14/22 1002  BP: 107/60  Pulse: 77  Weight: 247 lb (112 kg)    Fetal Status:           General:  Alert, oriented and cooperative. Patient is in no acute distress.  Skin: Skin is warm and dry. No rash noted.   Cardiovascular: Normal heart rate noted  Respiratory: Normal respiratory effort, no problems with respiration noted  Abdomen: Soft, gravid, appropriate for gestational age.        Pelvic: Cervical exam deferred        Extremities: Normal range of motion.     Mental Status: Normal mood and affect. Normal behavior. Normal judgment and thought content.   FH- 30 cm FHR- 140s  Assessment and Plan:  Pregnancy: [id number] at [redacted]w[redacted]d 1. BMI 40.0-44.9, adult (HCC) -no weight gained yet  2. Anemia during pregnancy in second trimester - on iron daily  3. Supervision of other normal pregnancy, antepartum - follow up ultrasound norma - She declines flu vacccine but is willing to get TDAP at next visit  4. Abnormal glucose affecting pregnancy - her early HBA1C was 5.8 but 1 hour GTT was normal about 2 weeks ago  Preterm labor symptoms and general obstetric precautions including but not limited to vaginal bleeding, contractions, leaking of  fluid and fetal movement were reviewed in detail with the patient. Please refer to After Visit Summary for other counseling recommendations.   No follow-ups on file.  No future appointments.  Allie Bossier, MD

## 2022-11-15 ENCOUNTER — Encounter: Payer: Self-pay | Admitting: Obstetrics & Gynecology

## 2022-11-28 NOTE — L&D Delivery Note (Signed)
The patient , at [phone] was checked and found to be 8 cms dilated. She had an epidural in place, and was feeling rectal pressure. At [place] she suddenly pushed spontaneously. Her RN called for stat assistance,   Delivery Note And At  0631,a viable female was delivered vaginally in one push over an intact perineum. The head presented direct OA and the shoulders and body delivered immediately , without assistance. A body cord was reduced once the baby was delivered. The baby was noted to have decreased tone and no respiratory effort. The NICU nurse was present and immediately provided the baby stimulation and drying on the maternal abdomen. The cord was then quickly double clamped and cut, an the baby take to the radiant warmer.  APGAR:2 , 9; weight  .pending.   Placenta status: delivered at ,0636  .  Cord: 3 vessel  with the following complications: none.    Anesthesia: Epidural  Episiotomy:  none Lacerations:  NA  Est. Blood Loss (mL):  25 cc  Mom to postpartum.  Baby to Couplet care / Skin to Skin.  Imagene Riches 02/16/2023, 7:07 AM

## 2022-12-01 ENCOUNTER — Encounter: Payer: Self-pay | Admitting: Certified Nurse Midwife

## 2022-12-01 ENCOUNTER — Ambulatory Visit (INDEPENDENT_AMBULATORY_CARE_PROVIDER_SITE_OTHER): Payer: Medicaid Other | Admitting: Certified Nurse Midwife

## 2022-12-01 VITALS — BP 100/64 | HR 73 | Wt 251.4 lb

## 2022-12-01 DIAGNOSIS — E668 Other obesity: Secondary | ICD-10-CM

## 2022-12-01 DIAGNOSIS — Z23 Encounter for immunization: Secondary | ICD-10-CM | POA: Diagnosis not present

## 2022-12-01 DIAGNOSIS — O99213 Obesity complicating pregnancy, third trimester: Secondary | ICD-10-CM

## 2022-12-01 DIAGNOSIS — Z3A29 29 weeks gestation of pregnancy: Secondary | ICD-10-CM

## 2022-12-01 LAB — POCT URINALYSIS DIPSTICK OB
Bilirubin, UA: NEGATIVE
Glucose, UA: NEGATIVE
Ketones, UA: NEGATIVE
Nitrite, UA: NEGATIVE
Spec Grav, UA: <=1.005 — AB (ref 1.010–1.025)
Urobilinogen, UA: 0.2 E.U./dL
pH, UA: 6 (ref 5.0–8.0)

## 2022-12-01 NOTE — Progress Notes (Addendum)
ROB doing well. Feels good movement. 28 wk labs today: Glucose screen/RPR/CBC. Tdap done, Blood transfusion consent completed, all questions answered. . Sample birth plan given, will follow up in upcoming visits. Discussed birth control after delivery, information pamphlet given. Discussed fetal movement and expectations around movement. She verbalizes and agrees to plan.   Follow up 2 wk for ROB or sooner if needed.   U/s ordered for growth due to obesity Body mass index is 43.15 kg/m.    Philip Aspen, CNM

## 2022-12-02 ENCOUNTER — Encounter: Payer: Self-pay | Admitting: Certified Nurse Midwife

## 2022-12-12 ENCOUNTER — Ambulatory Visit
Admission: RE | Admit: 2022-12-12 | Discharge: 2022-12-12 | Disposition: A | Discharge status: Home / Self Care | Payer: Medicaid Other | Source: Ambulatory Visit | Attending: Certified Nurse Midwife | Admitting: Certified Nurse Midwife

## 2022-12-12 DIAGNOSIS — Z3A30 30 weeks gestation of pregnancy: Secondary | ICD-10-CM | POA: Diagnosis not present

## 2022-12-12 DIAGNOSIS — O99213 Obesity complicating pregnancy, third trimester: Secondary | ICD-10-CM | POA: Insufficient documentation

## 2022-12-12 DIAGNOSIS — Z3689 Encounter for other specified antenatal screening: Secondary | ICD-10-CM | POA: Diagnosis not present

## 2022-12-12 DIAGNOSIS — Z3A29 29 weeks gestation of pregnancy: Secondary | ICD-10-CM

## 2022-12-12 DIAGNOSIS — O99210 Obesity complicating pregnancy, unspecified trimester: Secondary | ICD-10-CM

## 2022-12-12 DIAGNOSIS — E669 Obesity, unspecified: Secondary | ICD-10-CM | POA: Insufficient documentation

## 2022-12-12 DIAGNOSIS — E668 Other obesity: Secondary | ICD-10-CM | POA: Diagnosis present

## 2022-12-15 ENCOUNTER — Ambulatory Visit (INDEPENDENT_AMBULATORY_CARE_PROVIDER_SITE_OTHER): Payer: Medicaid Other | Admitting: Licensed Practical Nurse

## 2022-12-15 ENCOUNTER — Encounter: Payer: Self-pay | Admitting: Licensed Practical Nurse

## 2022-12-15 VITALS — BP 105/67 | HR 101 | Wt 250.0 lb

## 2022-12-15 DIAGNOSIS — O99891 Other specified diseases and conditions complicating pregnancy: Secondary | ICD-10-CM

## 2022-12-15 DIAGNOSIS — O26843 Uterine size-date discrepancy, third trimester: Secondary | ICD-10-CM

## 2022-12-15 DIAGNOSIS — R7301 Impaired fasting glucose: Secondary | ICD-10-CM

## 2022-12-15 DIAGNOSIS — Z3480 Encounter for supervision of other normal pregnancy, unspecified trimester: Secondary | ICD-10-CM

## 2022-12-15 DIAGNOSIS — O99013 Anemia complicating pregnancy, third trimester: Secondary | ICD-10-CM

## 2022-12-15 DIAGNOSIS — Z3A31 31 weeks gestation of pregnancy: Secondary | ICD-10-CM

## 2022-12-15 LAB — POCT URINALYSIS DIPSTICK
Bilirubin, UA: NEGATIVE
Blood, UA: NEGATIVE
Glucose, UA: NEGATIVE
Ketones, UA: NEGATIVE
Leukocytes, UA: NEGATIVE
Nitrite, UA: NEGATIVE
Protein, UA: NEGATIVE
Spec Grav, UA: 1.01 (ref 1.010–1.025)
Urobilinogen, UA: 0.2 E.U./dL
pH, UA: 6 (ref 5.0–8.0)

## 2022-12-15 NOTE — Progress Notes (Signed)
Routine Prenatal Care Visit  Subjective  Peggy Shaw is a 32 y.o. [phone] at [redacted]w[redacted]d being seen today for ongoing prenatal care.  She is currently monitored for the following issues for this high-risk pregnancy and has BMI 40.0-44.9, adult ([place]); Anemia in pregnancy; Smoker 5-7 cpd; Supervision of other normal pregnancy, antepartum; and Abnormal glucose affecting pregnancy on their problem list.  ----------------------------------------------------------------------------------- Patient reports  sleep is broken up, abel to fall asleep but wakes frequently. Has been crabby lately. .   -Is taking her Iron, will check CBC today has a 89 and 32 year old at home, her mother will watch them during labor  -had growth [place] EFW 54% 1635 grams, normal AFI   Contractions: Irritability. Vag. Bleeding: None.  Movement: Present. Leaking Fluid denies.  ----------------------------------------------------------------------------------- The following portions of the patient's history were reviewed and updated as appropriate: allergies, current medications, past family history, past medical history, past social history, past surgical history and problem list. Problem list updated.  Objective  Blood pressure 105/67, pulse (!) 101, weight 250 lb (113.4 kg), last menstrual period 05/12/2022. Pregravid weight 236 lb (107 kg) Total Weight Gain 14 lb (6.35 kg) Urinalysis: Urine Protein    Urine Glucose    Fetal Status: Fetal Heart Rate (bpm): 150 Fundal Height: 34 cm Movement: Present     General:  Alert, oriented and cooperative. Patient is in no acute distress.  Skin: Skin is warm and dry. No rash noted.   Cardiovascular: Normal heart rate noted  Respiratory: Normal respiratory effort, no problems with respiration noted  Abdomen: Soft, gravid, appropriate for gestational age. Pain/Pressure: Present     Pelvic:  Cervical exam deferred        Extremities: Normal range of motion.  Edema: None  Mental Status: Normal  mood and affect. Normal behavior. Normal judgment and thought content.   Assessment   32 y.o. [id number] at [redacted]w[redacted]d by  02/16/2023, by Last Menstrual Period presenting for routine prenatal visit S> D   Plan   fourth Problems (from 08/08/22 to present)     Problem Noted Resolved   Abnormal glucose affecting pregnancy 08/16/2022 by Emily Filbert, MD No        Preterm labor symptoms and general obstetric precautions including but not limited to vaginal bleeding, contractions, leaking of fluid and fetal movement were reviewed in detail with the patient. Please refer to After Visit Summary for other counseling recommendations.   Return in about 2 weeks (around 12/29/2022) for Raymond.  S>D but had recent US that shows normal growth, repeat US at 36 weeks   Roberto Scales, [facility]  12/15/22  2:04 PM

## 2022-12-16 ENCOUNTER — Encounter: Payer: Self-pay | Admitting: Licensed Practical Nurse

## 2022-12-16 ENCOUNTER — Other Ambulatory Visit: Payer: Self-pay | Admitting: Licensed Practical Nurse

## 2022-12-16 DIAGNOSIS — O99013 Anemia complicating pregnancy, third trimester: Secondary | ICD-10-CM

## 2022-12-16 LAB — CBC
Hematocrit: 30.1 % — ABNORMAL LOW (ref 34.0–46.6)
Hemoglobin: 9.6 g/dL — ABNORMAL LOW (ref 11.1–15.9)
MCH: 27.4 pg (ref 26.6–33.0)
MCHC: 31.9 g/dL (ref 31.5–35.7)
MCV: 86 fL (ref 79–97)
Platelets: 263 10*3/uL (ref 150–450)
RBC: 3.5 x10E6/uL — ABNORMAL LOW (ref 3.77–5.28)
RDW: 13.3 % (ref 11.7–15.4)
WBC: 7.4 10*3/uL (ref 3.4–10.8)

## 2022-12-16 NOTE — Progress Notes (Signed)
Hgb low despite Iron supplementation, referral to Hematology sent Kalyan Barabas, [facility]  12/16/22  5:23 PM

## 2022-12-19 ENCOUNTER — Other Ambulatory Visit: Payer: Self-pay | Admitting: Obstetrics

## 2022-12-19 DIAGNOSIS — O0990 Supervision of high risk pregnancy, unspecified, unspecified trimester: Secondary | ICD-10-CM

## 2022-12-19 MED ORDER — PRENATAL GUMMIES 0.18-25 MG PO CHEW: 1.0000 | CHEWABLE_TABLET | Freq: Every day | ORAL | 5 refills | Status: DC

## 2022-12-20 ENCOUNTER — Encounter: Payer: Self-pay | Admitting: Oncology

## 2022-12-20 NOTE — Progress Notes (Unsigned)
Call made to new patient. Chart reviewed and updated. Medication reconciliation completed. Referral reason, location directions and appt details given to pt. Visitor and mask policy also addressed with pt. All questions answered.   

## 2022-12-21 ENCOUNTER — Inpatient Hospital Stay: Payer: Medicaid Other

## 2022-12-21 ENCOUNTER — Inpatient Hospital Stay: Payer: Medicaid Other | Attending: Oncology | Admitting: Oncology

## 2022-12-21 VITALS — BP 107/64 | HR 75 | Temp 97.6°F | Resp 18 | Ht 64.0 in | Wt 252.6 lb

## 2022-12-21 DIAGNOSIS — O99013 Anemia complicating pregnancy, third trimester: Secondary | ICD-10-CM | POA: Diagnosis not present

## 2022-12-21 DIAGNOSIS — D509 Iron deficiency anemia, unspecified: Secondary | ICD-10-CM

## 2022-12-21 DIAGNOSIS — Z3A32 32 weeks gestation of pregnancy: Secondary | ICD-10-CM | POA: Diagnosis not present

## 2022-12-21 DIAGNOSIS — D508 Other iron deficiency anemias: Secondary | ICD-10-CM

## 2022-12-21 DIAGNOSIS — D649 Anemia, unspecified: Secondary | ICD-10-CM

## 2022-12-21 DIAGNOSIS — Z3A30 30 weeks gestation of pregnancy: Secondary | ICD-10-CM | POA: Insufficient documentation

## 2022-12-21 DIAGNOSIS — Z87891 Personal history of nicotine dependence: Secondary | ICD-10-CM | POA: Diagnosis not present

## 2022-12-21 LAB — IRON AND TIBC
Iron: 59 ug/dL (ref 28–170)
Saturation Ratios: 13 % (ref 10.4–31.8)
TIBC: 472 ug/dL — ABNORMAL HIGH (ref 250–450)
UIBC: 413 ug/dL

## 2022-12-21 LAB — CBC WITH DIFFERENTIAL/PLATELET
Abs Immature Granulocytes: 0.05 10*3/uL (ref 0.00–0.07)
Basophils Absolute: 0 10*3/uL (ref 0.0–0.1)
Basophils Relative: 0 %
Eosinophils Absolute: 0.1 10*3/uL (ref 0.0–0.5)
Eosinophils Relative: 1 %
HCT: 30.7 % — ABNORMAL LOW (ref 36.0–46.0)
Hemoglobin: 9.7 g/dL — ABNORMAL LOW (ref 12.0–15.0)
Immature Granulocytes: 1 %
Lymphocytes Relative: 19 %
Lymphs Abs: 1.3 10*3/uL (ref 0.7–4.0)
MCH: 27.5 pg (ref 26.0–34.0)
MCHC: 31.6 g/dL (ref 30.0–36.0)
MCV: 87 fL (ref 80.0–100.0)
Monocytes Absolute: 0.4 10*3/uL (ref 0.1–1.0)
Monocytes Relative: 6 %
Neutro Abs: 5 10*3/uL (ref 1.7–7.7)
Neutrophils Relative %: 73 %
Platelets: 260 10*3/uL (ref 150–400)
RBC: 3.53 MIL/uL — ABNORMAL LOW (ref 3.87–5.11)
RDW: 14.1 % (ref 11.5–15.5)
WBC: 6.8 10*3/uL (ref 4.0–10.5)
nRBC: 0 % (ref 0.0–0.2)

## 2022-12-21 LAB — RETIC PANEL
Immature Retic Fract: 10.9 % (ref 2.3–15.9)
RBC.: 3.58 MIL/uL — ABNORMAL LOW (ref 3.87–5.11)
Retic Count, Absolute: 85.6 10*3/uL (ref 19.0–186.0)
Retic Ct Pct: 2.4 % (ref 0.4–3.1)
Reticulocyte Hemoglobin: 28.9 pg (ref >27.9)

## 2022-12-21 LAB — FERRITIN: Ferritin: 8 ng/mL — ABNORMAL LOW (ref 11–307)

## 2022-12-21 LAB — FOLATE: Folate: 13.7 ng/mL (ref >5.9)

## 2022-12-21 LAB — VITAMIN B12: Vitamin B-12: 219 pg/mL (ref 180–914)

## 2022-12-21 LAB — TECHNOLOGIST SMEAR REVIEW: Plt Morphology: ADEQUATE

## 2022-12-21 NOTE — Assessment & Plan Note (Addendum)
I recommend checking CBC, CMP, reticulocyte panel, iron, TIBC ferritin, B12, folate, hemoglobinopathy evaluation.  -available blood work results were reviewed at the time of dictation.  Consistent with iron deficiency anemia.  I discussed about option of continue oral iron supplementation and repeat blood work for evaluation of treatment response.  If no significant improvement, then proceed with IV Venofer treatments. Alternative option of proceed with IV Venofer treatments now to ensure that she will have improved iron stores/anemia by the time of delivery. I discussed about the potential risks including but not limited to allergic reactions/infusion reactions including anaphylactic reactions, phlebitis, high blood pressure, wheezing, SOB, skin rash, weight gain,dark urine, leg swelling, back pain, headache, nausea and fatigue, etc. Venofer is assigned to FDA pregnancy category B based on safety studies in pregnancy. Patient tolerates oral iron supplement poorly and agrees with Venofer treatments.  Plan IV venofer weekly x 4

## 2022-12-21 NOTE — Progress Notes (Signed)
Pt in clinic for New pt consult for anemia. She is currently [redacted] weeks pregnant.

## 2022-12-21 NOTE — Progress Notes (Signed)
Hematology/Oncology Consult note [phone] [phone]      Patient Care Team: Pcp, No as PCP - General   REFERRING PROVIDER: Allen Derry, C*  CHIEF COMPLAINTS/REASON FOR VISIT:  Anemia  ASSESSMENT & PLAN:   IDA (iron deficiency anemia) I recommend checking CBC, CMP, reticulocyte panel, iron, TIBC ferritin, B12, folate, hemoglobinopathy evaluation.  -available blood work results were reviewed at the time of dictation.  Consistent with iron deficiency anemia.  I discussed about option of continue oral iron supplementation and repeat blood work for evaluation of treatment response.  If no significant improvement, then proceed with IV Venofer treatments. Alternative option of proceed with IV Venofer treatments now to ensure that she will have improved iron stores/anemia by the time of delivery. I discussed about the potential risks including but not limited to allergic reactions/infusion reactions including anaphylactic reactions, phlebitis, high blood pressure, wheezing, SOB, skin rash, weight gain,dark urine, leg swelling, back pain, headache, nausea and fatigue, etc. Venofer is assigned to FDA pregnancy category B based on safety studies in pregnancy. Patient tolerates oral iron supplement poorly and agrees with Venofer treatments.  Plan IV venofer weekly x 4   Orders Placed This Encounter  Procedures   CBC with Differential/Platelet    Standing Status:   Future    Number of Occurrences:   1    Standing Expiration Date:   12/22/2023   Retic Panel    Standing Status:   Future    Number of Occurrences:   1    Standing Expiration Date:   12/22/2023   Ferritin    Standing Status:   Future    Number of Occurrences:   1    Standing Expiration Date:   06/21/2023   Folate    Standing Status:   Future    Number of Occurrences:   1    Standing Expiration Date:   12/22/2023   Vitamin B12    Standing Status:   Future    Number of Occurrences:   1     Standing Expiration Date:   12/22/2023   Iron and TIBC    Standing Status:   Future    Number of Occurrences:   1    Standing Expiration Date:   12/22/2023   Technologist smear review    Standing Status:   Future    Number of Occurrences:   1    Standing Expiration Date:   12/22/2023    Order Specific Question:   Clinical information:    Answer:   anemia in pregnancy   Hgb Fractionation [place]    Standing Status:   Future    Number of Occurrences:   1    Standing Expiration Date:   12/22/2023    All questions were answered. The patient knows to call the clinic with any problems, questions or concerns.  Earlie Server, MD, PhD [facility] Hematology Oncology 12/21/2022     HISTORY OF PRESENTING ILLNESS:  Peggy Shaw is a  32 y.o.  female with PMH listed below who was referred to me for anemia Reviewed patient's recent labs that was done.  CBC on 12/15/2022 with hemoglobin 9.6, MCV 86. Reviewed patient's previous labs ordered by primary care physician's office, anemia is chronic onset , duration is since 2017. Currently patient is pregnant, in third trimester.  Denies any pregnancy complications. Patient reports feeling tired.  She was recommended to take oral iron supplementation which made her stomach sick and she she is not  able to tolerate. She denies recent chest pain on exertion, shortness of breath on minimal exertion, pre-syncopal episodes, or palpitations She had not noticed any recent bleeding such as epistaxis, hematuria or hematochezia.      MEDICAL HISTORY:  Past Medical History:  Diagnosis Date   Abnormal uterine bleeding    Migraines     SURGICAL HISTORY: Past Surgical History:  Procedure Laterality Date   HAND SURGERY Right 2015   PERIPHERAL VASCULAR THROMBECTOMY Right 2013   removed from right hand   WISDOM TOOTH EXTRACTION  2020   four    SOCIAL HISTORY: Social History   Socioeconomic History   Marital status: Single    Spouse name: Not on file    Number of children: 2   Years of education: 12   Highest education level: Not on file  Occupational History   Occupation: healthcare  Tobacco Use   Smoking status: Former    Packs/day: 0.25    Types: Cigarettes    Quit date: 03/04/2018    Years since quitting: 4.8   Smokeless tobacco: Never  Vaping Use   Vaping Use: Never used  Substance and Sexual Activity   Alcohol use: Not Currently    Alcohol/week: 3.0 standard drinks of alcohol    Types: 3 Shots of liquor per week    Comment: last use 02/10/22 3x/mo   Drug use: No   Sexual activity: Yes    Partners: Male    Birth control/protection: None  Other Topics Concern   Not on file  Social History Narrative   Not on file   Social Determinants of Health   Financial Resource Strain: High Risk (08/08/2022)   Overall Financial Resource Strain (CARDIA)    Difficulty of Paying Living Expenses: Hard  Food Insecurity: Food Insecurity Present (08/08/2022)   Hunger Vital Sign    Worried About Running Out of Food in the Last Year: Sometimes true    Ran Out of Food in the Last Year: Never true  Transportation Needs: No Transportation Needs (08/08/2022)   PRAPARE - Hydrologist (Medical): No    Lack of Transportation (Non-Medical): No  Physical Activity: Insufficiently Active (08/08/2022)   Exercise Vital Sign    Days of Exercise per Week: 2 days    Minutes of Exercise per Session: 30 min  Stress: No Stress Concern Present (08/08/2022)   [place]    Feeling of Stress : Not at all  Social Connections: Unknown (08/08/2022)   Social Connection and Isolation Panel [NHANES]    Frequency of Communication with Friends and Family: Twice a week    Frequency of Social Gatherings with Friends and Family: Once a week    Attends Religious Services: Never    Marine scientist or Organizations: No    Attends Archivist Meetings: Never     Marital Status: Not on file  Intimate Partner Violence: Not At Risk (08/08/2022)   Humiliation, Afraid, Rape, and Kick questionnaire    Fear of Current or Ex-Partner: No    Emotionally Abused: No    Physically Abused: No    Sexually Abused: No    FAMILY HISTORY: Family History  Problem Relation Age of Onset   Seizures Sister    Lung cancer Maternal Aunt    Diabetes Neg Hx    Hypertension Neg Hx    Stroke Neg Hx    Cancer Neg Hx    Thyroid disease  Neg Hx     ALLERGIES:  has No Known Allergies.  MEDICATIONS:  Current Outpatient Medications  Medication Sig Dispense Refill   ferrous sulfate (FERROUSUL) 325 (65 FE) MG tablet Take 1 tablet (325 mg total) by mouth daily with breakfast. 60 tablet 1   Prenatal MV & Min w/FA-DHA (PRENATAL GUMMIES) 0.18-25 MG CHEW Chew 1 tablet by mouth daily. 30 tablet 5   No current facility-administered medications for this visit.    Review of Systems  Constitutional:  Positive for fatigue. Negative for appetite change, chills and fever.  HENT:   Negative for hearing loss and voice change.   Eyes:  Negative for eye problems.  Respiratory:  Negative for chest tightness and cough.   Cardiovascular:  Negative for chest pain.  Gastrointestinal:  Negative for abdominal distention, abdominal pain and blood in stool.  Endocrine: Negative for hot flashes.  Genitourinary:  Negative for difficulty urinating and frequency.   Musculoskeletal:  Negative for arthralgias.  Skin:  Negative for itching and rash.  Neurological:  Negative for extremity weakness.  Hematological:  Negative for adenopathy.  Psychiatric/Behavioral:  Negative for confusion.     PHYSICAL EXAMINATION:  Vitals:   12/21/22 0945  BP: 107/64  Pulse: 75  Resp: 18  Temp: 97.6 F (36.4 C)   Filed Weights   12/21/22 0945  Weight: 252 lb 9.6 oz (114.6 kg)    Physical Exam Constitutional:      General: She is not in acute distress. HENT:     Head: Normocephalic and atraumatic.   Eyes:     General: No scleral icterus. Cardiovascular:     Rate and Rhythm: Normal rate and regular rhythm.  Pulmonary:     Effort: Pulmonary effort is normal. No respiratory distress.     Breath sounds: No wheezing.  Abdominal:     Comments: Gravid uterus  Musculoskeletal:        General: No deformity. Normal range of motion.     Cervical back: Normal range of motion and neck supple.  Skin:    General: Skin is warm and dry.     Findings: No erythema or rash.  Neurological:     Mental Status: She is alert and oriented to person, place, and time. Mental status is at baseline.     Cranial Nerves: No cranial nerve deficit.     Coordination: Coordination normal.  Psychiatric:        Mood and Affect: Mood normal.      LABORATORY DATA:  I have reviewed the data as listed    Latest Ref Rng & Units 12/21/2022   10:11 AM 12/15/2022   11:54 AM 10/27/2022    9:58 AM  CBC  WBC 4.0 - 10.5 K/uL 6.8  7.4  7.2   Hemoglobin 12.0 - 15.0 g/dL 9.7  9.6  9.7   Hematocrit 36.0 - 46.0 % 30.7  30.1  29.3   Platelets 150 - 400 K/uL 260  263  245       Latest Ref Rng & Units 08/15/2022    9:13 AM 05/02/2021    2:36 AM 03/13/2018    4:27 PM  CMP  Glucose 70 - 99 mg/dL 82  149  90   BUN 6 - 20 mg/dL 7  10  14    Creatinine 0.57 - 1.00 mg/dL  7.02  6.37   Sodium 134 - 144 mmol/L 139  133  140   Potassium 3.5 - 5.2 mmol/L 3.5  3.9  4.6  Chloride 96 - 106 mmol/L 103  102  104   CO2 20 - 29 mmol/L 23  23  21    Calcium 8.7 - 10.2 mg/dL 9.1  8.7  9.6   Total Protein 6.0 - 8.5 g/dL 6.0  7.4    Total Bilirubin 0.0 - 1.2 mg/dL <0.2  0.6    Alkaline Phos 44 - 121 IU/L 70  69    AST 0 - 40 IU/L 10  18    ALT 0 - 32 IU/L 9  17        Component Value Date/Time   IRON 59 12/21/2022 1011   TIBC 472 (H) 12/21/2022 1011   FERRITIN 8 (L) 12/21/2022 1011   IRONPCTSAT 13 12/21/2022 1011     RADIOGRAPHIC STUDIES: I have personally reviewed the radiological images as listed and agreed with the  findings in the report.

## 2022-12-23 LAB — HGB FRACTIONATION CASCADE
Hgb A2: 2.4 % (ref 1.8–3.2)
Hgb A: 97.6 % (ref 96.4–98.8)
Hgb F: 0 % (ref 0.0–2.0)
Hgb S: 0 %

## 2022-12-25 ENCOUNTER — Observation Stay
Admission: EM | Admit: 2022-12-25 | Discharge: 2022-12-25 | Disposition: A | Discharge status: Home / Self Care | Payer: Medicaid Other | Attending: Obstetrics and Gynecology | Admitting: Obstetrics and Gynecology

## 2022-12-25 ENCOUNTER — Encounter: Payer: Self-pay | Admitting: Obstetrics and Gynecology

## 2022-12-25 ENCOUNTER — Other Ambulatory Visit: Payer: Self-pay

## 2022-12-25 DIAGNOSIS — O26893 Other specified pregnancy related conditions, third trimester: Principal | ICD-10-CM | POA: Insufficient documentation

## 2022-12-25 DIAGNOSIS — R109 Unspecified abdominal pain: Secondary | ICD-10-CM

## 2022-12-25 DIAGNOSIS — Z87891 Personal history of nicotine dependence: Secondary | ICD-10-CM | POA: Diagnosis not present

## 2022-12-25 DIAGNOSIS — Z3A32 32 weeks gestation of pregnancy: Secondary | ICD-10-CM | POA: Diagnosis not present

## 2022-12-25 DIAGNOSIS — O99810 Abnormal glucose complicating pregnancy: Secondary | ICD-10-CM

## 2022-12-25 DIAGNOSIS — O26899 Other specified pregnancy related conditions, unspecified trimester: Secondary | ICD-10-CM | POA: Diagnosis present

## 2022-12-25 LAB — URINALYSIS, COMPLETE (UACMP) WITH MICROSCOPIC
Bilirubin Urine: NEGATIVE
Glucose, UA: NEGATIVE mg/dL
Ketones, ur: 5 mg/dL — AB
Nitrite: NEGATIVE
Protein, ur: 30 mg/dL — AB
Specific Gravity, Urine: 1.019 (ref 1.005–1.030)
pH: 6 (ref 5.0–8.0)

## 2022-12-25 LAB — WET PREP, GENITAL
Clue Cells Wet Prep HPF POC: NONE SEEN
Sperm: NONE SEEN
Trich, Wet Prep: NONE SEEN
WBC, Wet Prep HPF POC: <10 (ref <10)
Yeast Wet Prep HPF POC: NONE SEEN

## 2022-12-25 LAB — CHLAMYDIA/NGC RT PCR (ARMC ONLY)
Chlamydia Tr: NOT DETECTED
N gonorrhoeae: NOT DETECTED

## 2022-12-25 LAB — RUPTURE OF MEMBRANE (ROM)PLUS: Rom Plus: NEGATIVE

## 2022-12-25 MED ORDER — NITROFURANTOIN MONOHYD MACRO 100 MG PO CAPS: 100.0000 mg | ORAL_CAPSULE | Freq: Two times a day (BID) | ORAL | 0 refills | Status: AC

## 2022-12-25 NOTE — OB Triage Note (Signed)
Pt is a G4P2 and [redacted]w[redacted]d presenting to L&D with complaint of lower abdominal and vaginal pressure that started at 2200 last night. Pt also reports a "small wet spot" in the bed during the middle of the night but cannot confirm color and denies any other leaking since. Pt denies VB and confirms positive fetal movement. VSS and monitors applied. CNM made aware of pts arrival.

## 2022-12-25 NOTE — OB Triage Note (Signed)
Discharge instructions reviewed, prescriptions and follow-up care discussed. Pt verbalized agreement and understanding. All questions answered. Pt stable at the time of discharge home by self.

## 2022-12-25 NOTE — Final Progress Note (Signed)
OB/Triage Note  Patient ID: Peggy Shaw MRN: [id number] DOB/AGE: [birth year] 32 y.o.  Subjective  History of Present Illness: The patient is a 32 y.o. female [phone] at [redacted]w[redacted]d who presents for lower abd pressure which started at 2200 last night. Also during the night reported LOF once, has not continued. Denies contractions. Denies frequency, dysuria, hematuria, VB. Denies hx of UTIs. .   Past Medical History:  Diagnosis Date   Abnormal uterine bleeding    Migraines     Past Surgical History:  Procedure Laterality Date   HAND SURGERY Right 2015   PERIPHERAL VASCULAR THROMBECTOMY Right 2013   removed from right hand   [place]   four    No current facility-administered medications on file prior to encounter.   Current Outpatient Medications on File Prior to Encounter  Medication Sig Dispense Refill   ferrous sulfate (FERROUSUL) 325 (65 FE) MG tablet Take 1 tablet (325 mg total) by mouth daily with breakfast. 60 tablet 1   Prenatal MV & Min w/FA-DHA (PRENATAL GUMMIES) 0.18-25 MG CHEW Chew 1 tablet by mouth daily. (Patient not taking: Reported on 12/25/2022) 30 tablet 5    No Known Allergies  Social History   Socioeconomic History   Marital status: Single    Spouse name: Not on file   Number of children: 2   Years of education: 12   Highest education level: Not on file  Occupational History   Occupation: healthcare  Tobacco Use   Smoking status: Former    Packs/day: 0.25    Types: Cigarettes    Quit date: 03/04/2018    Years since quitting: 4.8   Smokeless tobacco: Never  Vaping Use   Vaping Use: Never used  Substance and Sexual Activity   Alcohol use: Not Currently    Alcohol/week: 3.0 standard drinks of alcohol    Types: 3 Shots of liquor per week    Comment: last use 02/10/22 3x/mo   Drug use: No   Sexual activity: Yes    Partners: Male    Birth control/protection: None  Other Topics Concern   Not on file  Social History Narrative    Not on file   Social Determinants of Health   Financial Resource Strain: High Risk (08/08/2022)   Overall Financial Resource Strain (CARDIA)    Difficulty of Paying Living Expenses: Hard  Food Insecurity: Food Insecurity Present (08/08/2022)   Hunger Vital Sign    Worried About Running Out of Food in the Last Year: Sometimes true    Ran Out of Food in the Last Year: Never true  Transportation Needs: No Transportation Needs (08/08/2022)   PRAPARE - Hydrologist (Medical): No    Lack of Transportation (Non-Medical): No  Physical Activity: Insufficiently Active (08/08/2022)   Exercise Vital Sign    Days of Exercise per Week: 2 days    Minutes of Exercise per Session: 30 min  Stress: No Stress Concern Present (08/08/2022)   [place]    Feeling of Stress : Not at all  Social Connections: Unknown (08/08/2022)   Social Connection and Isolation Panel [NHANES]    Frequency of Communication with Friends and Family: Twice a week    Frequency of Social Gatherings with Friends and Family: Once a week    Attends Religious Services: Never    Marine scientist or Organizations: No    Attends Archivist Meetings: Never  Marital Status: Not on file  Intimate Partner Violence: Not At Risk (08/08/2022)   Humiliation, Afraid, Rape, and Kick questionnaire    Fear of Current or Ex-Partner: No    Emotionally Abused: No    Physically Abused: No    Sexually Abused: No    Family History  Problem Relation Age of Onset   Seizures Sister    Lung cancer Maternal Aunt    Diabetes Neg Hx    Hypertension Neg Hx    Stroke Neg Hx    Cancer Neg Hx    Thyroid disease Neg Hx      ROS    Objective  Physical Exam: BP (!) 100/52   Pulse 75   Temp 98.2 F (36.8 C) (Oral)   Resp 18   Ht 5\' 4"  (1.626 m)   Wt 114.6 kg   LMP 05/12/2022 (Exact Date)   BMI 43.36 kg/m   OBGyn Exam  FHT 135,  mod variability, pos accels, no decels Toco no contractions detected  Significant Findings/ Diagnostic Studies:  ROM plus neg Wet prep neg GC/CT pending, patient has no concerns about infection UA: small blood/portein, trace leukocytes, many bacteria   Hospital Course: Had RNST, all resulted labs WNL except UA which is suggestive of UTI. Will presumptively treat for UTI with macrobid while awaiting culture results. Has f/u ROB on Thursday with Sunday CNM.  Assessment: 32 y.o. female 38 at [redacted]w[redacted]d  Likely UTI RNST  Plan: Take full seven days of macrobid twice daily 2. Drink plenty of water and empty bladder regularly 3. Return for worsening symptoms or no improvement 4. Keep ROB appt for Thursday 5. Urine sent for culture  Discharge Instructions     Discharge activity:  No Restrictions   Complete by: As directed    Discharge diet:  No restrictions   Complete by: As directed    Discharge instructions   Complete by: As directed    1. Take full seven days of macrobid twice daily 2. Drink plenty of water and empty bladder regularly 3. Return for worsening symptoms or no improvement 4. Keep ROB appt for Thursday   Notify physician for a general feeling that "something is not right"   Complete by: As directed    Notify physician for increase or change in vaginal discharge   Complete by: As directed    Notify physician for intestinal cramps, with or without diarrhea, sometimes described as "gas pain"   Complete by: As directed    Notify physician for leaking of fluid   Complete by: As directed    Notify physician for low, dull backache, unrelieved by heat or Tylenol   Complete by: As directed    Notify physician for menstrual like cramps   Complete by: As directed    Notify physician for pelvic pressure   Complete by: As directed    Notify physician for uterine contractions.  These may be painless and feel like the uterus is tightening or the baby is  "balling up"    Complete by: As directed    Notify physician for vaginal bleeding   Complete by: As directed    PRETERM LABOR:  Includes any of the follwing symptoms that occur between 20 - [redacted] weeks gestation.  If these symptoms are not stopped, preterm labor can result in preterm delivery, placing your baby at risk   Complete by: As directed       Allergies as of 12/25/2022   No Known Allergies  Medication List     TAKE these medications    ferrous sulfate 325 (65 FE) MG tablet Commonly known as: FerrouSul Take 1 tablet (325 mg total) by mouth daily with breakfast.   nitrofurantoin (macrocrystal-monohydrate) 100 MG capsule Commonly known as: Macrobid Take 1 capsule (100 mg total) by mouth 2 (two) times daily for 7 days.   Prenatal Gummies 0.18-25 MG Chew Chew 1 tablet by mouth daily.        Follow-up [facility] at [facility] Follow up.   Specialty: Obstetrics and Gynecology Why: Keep ROB appt with Alma Friendly on Thursday Contact information: [address] [place] [address] [phone]                Total time spent taking care of this patient: 45 minutes  Signed: Maggie Font CNM, FNP 12/25/2022, 2:30 PM

## 2022-12-29 ENCOUNTER — Ambulatory Visit (INDEPENDENT_AMBULATORY_CARE_PROVIDER_SITE_OTHER): Payer: Medicaid Other | Admitting: Advanced Practice Midwife

## 2022-12-29 ENCOUNTER — Encounter: Payer: Self-pay | Admitting: Advanced Practice Midwife

## 2022-12-29 VITALS — BP 100/60 | Wt 252.0 lb

## 2022-12-29 DIAGNOSIS — Z3480 Encounter for supervision of other normal pregnancy, unspecified trimester: Secondary | ICD-10-CM

## 2022-12-29 DIAGNOSIS — Z3483 Encounter for supervision of other normal pregnancy, third trimester: Secondary | ICD-10-CM

## 2022-12-29 DIAGNOSIS — Z3A33 33 weeks gestation of pregnancy: Secondary | ICD-10-CM

## 2022-12-29 LAB — POCT URINALYSIS DIPSTICK OB
Bilirubin, UA: NEGATIVE
Blood, UA: NEGATIVE
Glucose, UA: NEGATIVE
Ketones, UA: NEGATIVE
Leukocytes, UA: NEGATIVE
Nitrite, UA: NEGATIVE
POC,PROTEIN,UA: NEGATIVE
Spec Grav, UA: 1.01 (ref 1.010–1.025)
Urobilinogen, UA: 1 E.U./dL
pH, UA: 6 (ref 5.0–8.0)

## 2022-12-29 NOTE — Progress Notes (Signed)
Routine Prenatal Care Visit  Subjective  Peggy Shaw is a 32 y.o. [phone] at [redacted]w[redacted]d being seen today for ongoing prenatal care.  She is currently monitored for the following issues for this low-risk pregnancy and has BMI 40.0-44.9, adult ([place]); IDA (iron deficiency anemia); Smoker 5-7 cpd; Supervision of other normal pregnancy, antepartum; Abnormal glucose affecting pregnancy; and Abdominal pain affecting pregnancy, antepartum on their problem list.  ----------------------------------------------------------------------------------- Patient reports no complaints.   Contractions: Not present. Vag. Bleeding: None.  Movement: Present. Leaking Fluid denies.  ----------------------------------------------------------------------------------- The following portions of the patient's history were reviewed and updated as appropriate: allergies, current medications, past family history, past medical history, past social history, past surgical history and problem list. Problem list updated.  Objective  Blood pressure 100/60, weight 252 lb (114.3 kg), last menstrual period 05/12/2022. Pregravid weight 236 lb (107 kg) Total Weight Gain 16 lb (7.258 kg) Urinalysis: Urine Protein    Urine Glucose    Fetal Status: Fetal Heart Rate (bpm): 147 Fundal Height: 35 cm Movement: Present     Growth scan 1/15: 44%, 1635 g, AFI 19 cm, cephalic  General:  Alert, oriented and cooperative. Patient is in no acute distress.  Skin: Skin is warm and dry. No rash noted.   Cardiovascular: Normal heart rate noted  Respiratory: Normal respiratory effort, no problems with respiration noted  Abdomen: Soft, gravid, appropriate for gestational age. Pain/Pressure: Absent     Pelvic:  Cervical exam deferred        Extremities: Normal range of motion.  Edema: None  Mental Status: Normal mood and affect. Normal behavior. Normal judgment and thought content.   Assessment   32 y.o. [id number] at [redacted]w[redacted]d by  02/16/2023, by Last  Menstrual Period presenting for routine prenatal visit  Plan   fourth Problems (from 08/08/22 to present)     Problem Noted Resolved   Abnormal glucose affecting pregnancy 08/16/2022 by Emily Filbert, MD No        Preterm labor symptoms and general obstetric precautions including but not limited to vaginal bleeding, contractions, leaking of fluid and fetal movement were reviewed in detail with the patient. Please refer to After Visit Summary for other counseling recommendations.   Return in about 2 weeks (around 01/12/2023) for rob.  Rod Can, CNM 12/29/2022 11:12 AM

## 2023-01-01 ENCOUNTER — Other Ambulatory Visit: Payer: Self-pay | Admitting: Oncology

## 2023-01-01 DIAGNOSIS — D508 Other iron deficiency anemias: Secondary | ICD-10-CM

## 2023-01-01 DIAGNOSIS — D649 Anemia, unspecified: Secondary | ICD-10-CM

## 2023-01-02 ENCOUNTER — Telehealth: Payer: Self-pay

## 2023-01-02 NOTE — Telephone Encounter (Signed)
Detailed VM and myhchart message left on ph informing pt of appts:   Venofer weekly x4 Labs in 3 months  MD/ venofer 1-2 days after labs

## 2023-01-02 NOTE — Telephone Encounter (Signed)
-----   Message from Earlie Server, MD sent at 01/01/2023 10:43 PM EST ----- Please arrange her to get IV venofer weekly x 4.  Recommend 3 months follow up,  Labs prior to MD +/- Venofer. Labs are ordered

## 2023-01-03 ENCOUNTER — Encounter: Payer: Self-pay | Admitting: Oncology

## 2023-01-09 ENCOUNTER — Ambulatory Visit
Admission: RE | Admit: 2023-01-09 | Discharge: 2023-01-09 | Disposition: A | Discharge status: Home / Self Care | Payer: Medicaid Other | Source: Ambulatory Visit | Attending: Licensed Practical Nurse | Admitting: Licensed Practical Nurse

## 2023-01-09 DIAGNOSIS — Z3A34 34 weeks gestation of pregnancy: Secondary | ICD-10-CM | POA: Diagnosis not present

## 2023-01-09 DIAGNOSIS — Z3480 Encounter for supervision of other normal pregnancy, unspecified trimester: Secondary | ICD-10-CM

## 2023-01-09 DIAGNOSIS — O99213 Obesity complicating pregnancy, third trimester: Secondary | ICD-10-CM | POA: Insufficient documentation

## 2023-01-10 ENCOUNTER — Inpatient Hospital Stay: Payer: Medicaid Other | Attending: Oncology

## 2023-01-10 VITALS — BP 109/53 | HR 79 | Temp 96.6°F | Resp 18

## 2023-01-10 DIAGNOSIS — D509 Iron deficiency anemia, unspecified: Secondary | ICD-10-CM | POA: Insufficient documentation

## 2023-01-10 DIAGNOSIS — Z3A32 32 weeks gestation of pregnancy: Secondary | ICD-10-CM | POA: Diagnosis present

## 2023-01-10 DIAGNOSIS — O99013 Anemia complicating pregnancy, third trimester: Secondary | ICD-10-CM | POA: Diagnosis present

## 2023-01-10 DIAGNOSIS — D508 Other iron deficiency anemias: Secondary | ICD-10-CM

## 2023-01-10 MED ORDER — SODIUM CHLORIDE 0.9 % IV SOLN
200.0000 mg | Freq: Once | INTRAVENOUS | Status: AC
  Administered 2023-01-10: 200 mg via INTRAVENOUS
  Filled 2023-01-10: qty 200

## 2023-01-10 MED ORDER — SODIUM CHLORIDE 0.9 % IV SOLN
Freq: Once | INTRAVENOUS | Status: AC
  Filled 2023-01-10: qty 250

## 2023-01-11 ENCOUNTER — Telehealth: Payer: Self-pay | Admitting: Advanced Practice Midwife

## 2023-01-11 NOTE — Telephone Encounter (Signed)
Left message for patient to call office back to r/s appt for 01/12/23. JEG out sick

## 2023-01-12 ENCOUNTER — Encounter: Payer: Medicaid Other | Admitting: Advanced Practice Midwife

## 2023-01-16 ENCOUNTER — Encounter: Payer: Medicaid Other | Admitting: Advanced Practice Midwife

## 2023-01-17 ENCOUNTER — Inpatient Hospital Stay: Payer: Medicaid Other

## 2023-01-17 VITALS — BP 107/74 | HR 79 | Temp 99.1°F | Resp 18

## 2023-01-17 DIAGNOSIS — D508 Other iron deficiency anemias: Secondary | ICD-10-CM

## 2023-01-17 DIAGNOSIS — D509 Iron deficiency anemia, unspecified: Secondary | ICD-10-CM | POA: Diagnosis not present

## 2023-01-17 MED ORDER — SODIUM CHLORIDE 0.9 % IV SOLN
200.0000 mg | Freq: Once | INTRAVENOUS | Status: AC
  Administered 2023-01-17: 200 mg via INTRAVENOUS
  Filled 2023-01-17: qty 200

## 2023-01-17 MED ORDER — SODIUM CHLORIDE 0.9 % IV SOLN
Freq: Once | INTRAVENOUS | Status: AC
  Filled 2023-01-17: qty 250

## 2023-01-17 NOTE — Telephone Encounter (Signed)
Pt is scheduled with Dr. Marcelline Mates at 8:55 on 01/18/2023.

## 2023-01-18 ENCOUNTER — Encounter: Payer: Self-pay | Admitting: Obstetrics and Gynecology

## 2023-01-18 ENCOUNTER — Other Ambulatory Visit (HOSPITAL_COMMUNITY)
Admission: RE | Admit: 2023-01-18 | Discharge: 2023-01-18 | Disposition: A | Discharge status: Home / Self Care | Payer: Medicaid Other | Source: Ambulatory Visit | Attending: Obstetrics and Gynecology | Admitting: Obstetrics and Gynecology

## 2023-01-18 ENCOUNTER — Ambulatory Visit (INDEPENDENT_AMBULATORY_CARE_PROVIDER_SITE_OTHER): Payer: Medicaid Other | Admitting: Obstetrics and Gynecology

## 2023-01-18 VITALS — BP 123/49 | HR 83 | Wt 254.1 lb

## 2023-01-18 DIAGNOSIS — Z3A35 35 weeks gestation of pregnancy: Secondary | ICD-10-CM

## 2023-01-18 DIAGNOSIS — Z3480 Encounter for supervision of other normal pregnancy, unspecified trimester: Secondary | ICD-10-CM | POA: Insufficient documentation

## 2023-01-18 DIAGNOSIS — Z113 Encounter for screening for infections with a predominantly sexual mode of transmission: Secondary | ICD-10-CM | POA: Diagnosis present

## 2023-01-18 DIAGNOSIS — O99210 Obesity complicating pregnancy, unspecified trimester: Secondary | ICD-10-CM

## 2023-01-18 DIAGNOSIS — O4103X0 Oligohydramnios, third trimester, not applicable or unspecified: Secondary | ICD-10-CM

## 2023-01-18 DIAGNOSIS — D649 Anemia, unspecified: Secondary | ICD-10-CM

## 2023-01-18 DIAGNOSIS — O0993 Supervision of high risk pregnancy, unspecified, third trimester: Secondary | ICD-10-CM

## 2023-01-18 DIAGNOSIS — O99013 Anemia complicating pregnancy, third trimester: Secondary | ICD-10-CM | POA: Insufficient documentation

## 2023-01-18 DIAGNOSIS — Z3685 Encounter for antenatal screening for Streptococcus B: Secondary | ICD-10-CM

## 2023-01-18 DIAGNOSIS — O09299 Supervision of pregnancy with other poor reproductive or obstetric history, unspecified trimester: Secondary | ICD-10-CM | POA: Insufficient documentation

## 2023-01-18 LAB — POCT URINALYSIS DIPSTICK OB
Bilirubin, UA: NEGATIVE
Blood, UA: NEGATIVE
Glucose, UA: NEGATIVE
Ketones, UA: NEGATIVE
Leukocytes, UA: NEGATIVE
Nitrite, UA: NEGATIVE
POC,PROTEIN,UA: NEGATIVE
Spec Grav, UA: 1.02 (ref 1.010–1.025)
Urobilinogen, UA: 0.2 E.U./dL
pH, UA: 6.5 (ref 5.0–8.0)

## 2023-01-18 NOTE — Progress Notes (Signed)
ROB [redacted]w[redacted]d She is doing well. She reports good fetal movement. No new concerns today. Cultures done today.

## 2023-01-18 NOTE — Progress Notes (Signed)
ROB: Patient is a 32 y.o. [id number] at 65w6dwho presents for OB care. Patient currently with h/o anemia, getting iron infusions with Hematology, most recent one was last week. Also with obesity in pregnancy.  Overall doing well, no issues. 36 week cultures performed today.   Plans for epidural in labor.  H/H drawn today.  Discussed need for antenatal surveillance. To begin NSTs weekly, will order next growth scan for 2 weeks (with BPP).  Discussed recommendations for IOL by 40 weeks, patient ok to wait until her due date prior to scheduling. Patient also mentions having to be induced in her last pregnancy due to oligohydramnios at 38 weeks. Will assess fluid levels with BPP when scheduled.

## 2023-01-19 LAB — HEMOGLOBIN AND HEMATOCRIT, BLOOD
Hematocrit: 31.3 % — ABNORMAL LOW (ref 34.0–46.6)
Hemoglobin: 10.3 g/dL — ABNORMAL LOW (ref 11.1–15.9)

## 2023-01-19 LAB — CERVICOVAGINAL ANCILLARY ONLY
Chlamydia: NEGATIVE
Comment: NEGATIVE
Comment: NORMAL
Neisseria Gonorrhea: NEGATIVE

## 2023-01-20 LAB — STREP GP B NAA: Strep Gp B NAA: NEGATIVE

## 2023-01-24 ENCOUNTER — Inpatient Hospital Stay: Payer: Medicaid Other

## 2023-01-24 VITALS — BP 105/64 | HR 77 | Temp 98.6°F | Resp 18

## 2023-01-24 DIAGNOSIS — D509 Iron deficiency anemia, unspecified: Secondary | ICD-10-CM | POA: Diagnosis not present

## 2023-01-24 DIAGNOSIS — D508 Other iron deficiency anemias: Secondary | ICD-10-CM

## 2023-01-24 MED ORDER — SODIUM CHLORIDE 0.9 % IV SOLN
Freq: Once | INTRAVENOUS | Status: AC
  Filled 2023-01-24: qty 250

## 2023-01-24 MED ORDER — SODIUM CHLORIDE 0.9 % IV SOLN
200.0000 mg | Freq: Once | INTRAVENOUS | Status: AC
  Administered 2023-01-24: 200 mg via INTRAVENOUS
  Filled 2023-01-24: qty 200

## 2023-01-26 ENCOUNTER — Telehealth: Payer: Self-pay | Admitting: Obstetrics and Gynecology

## 2023-01-26 NOTE — Progress Notes (Signed)
    NURSE VISIT NOTE  Subjective:    Patient ID: Peggy Shaw, female    DOB: 1991-03-23, 32 y.o.   MRN: [id number]  HPI  Patient is a 32 y.o. [phone] female who presents for fetal monitoring per order from Rubie Maid, MD.   Objective:    BP 108/69   Pulse 79   Ht '5\' 4"'$  (1.626 m)   Wt 259 lb 8 oz (117.7 kg)   LMP 05/12/2022 (Exact Date)   BMI 44.54 kg/m  Estimated Date of Delivery: 02/16/23  70w1dFetus A Non-Stress Test Interpretation for 01/27/23  Indication:  Obesity  Fetal Heart Rate A Mode: External Baseline Rate (A): 155 bpm Variability: Moderate Accelerations: 15 x 15 Decelerations: None Multiple birth?: No  Uterine Activity Mode: Toco Contraction Frequency (min): None  Interpretation (Fetal Testing) Nonstress Test Interpretation: Reactive Overall Impression: Reassuring for gestational age     Assessment:   1. Pregnancy, supervision, high-risk, third trimester   2. Obesity affecting pregnancy, antepartum, unspecified obesity type   3. Uterine size date discrepancy pregnancy, third trimester   4. [redacted] weeks gestation of pregnancy      Plan:   Results reviewed and discussed with patient by  DJeannie Fend MD.     COtila Kluver LPN

## 2023-01-26 NOTE — Telephone Encounter (Signed)
no ultrasound, patient aware of new location, date, time , full bladder  via detailed voicemail

## 2023-01-27 ENCOUNTER — Encounter: Payer: Medicaid Other | Admitting: Obstetrics and Gynecology

## 2023-01-27 ENCOUNTER — Ambulatory Visit (INDEPENDENT_AMBULATORY_CARE_PROVIDER_SITE_OTHER): Payer: Medicaid Other

## 2023-01-27 VITALS — BP 108/69 | HR 79 | Ht 64.0 in | Wt 259.5 lb

## 2023-01-27 DIAGNOSIS — E669 Obesity, unspecified: Secondary | ICD-10-CM | POA: Diagnosis not present

## 2023-01-27 DIAGNOSIS — O99213 Obesity complicating pregnancy, third trimester: Secondary | ICD-10-CM

## 2023-01-27 DIAGNOSIS — O0993 Supervision of high risk pregnancy, unspecified, third trimester: Secondary | ICD-10-CM

## 2023-01-27 DIAGNOSIS — Z3A37 37 weeks gestation of pregnancy: Secondary | ICD-10-CM | POA: Diagnosis not present

## 2023-01-27 DIAGNOSIS — O99210 Obesity complicating pregnancy, unspecified trimester: Secondary | ICD-10-CM

## 2023-01-27 DIAGNOSIS — O26843 Uterine size-date discrepancy, third trimester: Secondary | ICD-10-CM | POA: Insufficient documentation

## 2023-01-27 NOTE — Patient Instructions (Signed)

## 2023-01-31 ENCOUNTER — Encounter: Payer: Self-pay | Admitting: Obstetrics and Gynecology

## 2023-01-31 ENCOUNTER — Inpatient Hospital Stay: Payer: Medicaid Other | Attending: Oncology

## 2023-01-31 VITALS — BP 102/62 | HR 83 | Temp 97.9°F

## 2023-01-31 DIAGNOSIS — Z3A37 37 weeks gestation of pregnancy: Secondary | ICD-10-CM | POA: Insufficient documentation

## 2023-01-31 DIAGNOSIS — D509 Iron deficiency anemia, unspecified: Secondary | ICD-10-CM | POA: Insufficient documentation

## 2023-01-31 DIAGNOSIS — D508 Other iron deficiency anemias: Secondary | ICD-10-CM

## 2023-01-31 DIAGNOSIS — O99013 Anemia complicating pregnancy, third trimester: Secondary | ICD-10-CM | POA: Diagnosis present

## 2023-01-31 MED ORDER — SODIUM CHLORIDE 0.9 % IV SOLN
Freq: Once | INTRAVENOUS | Status: AC
  Filled 2023-01-31: qty 250

## 2023-01-31 MED ORDER — SODIUM CHLORIDE 0.9 % IV SOLN
200.0000 mg | Freq: Once | INTRAVENOUS | Status: AC
  Administered 2023-01-31: 200 mg via INTRAVENOUS
  Filled 2023-01-31: qty 200

## 2023-01-31 NOTE — Patient Instructions (Signed)

## 2023-01-31 NOTE — Patient Instructions (Signed)

## 2023-01-31 NOTE — Progress Notes (Addendum)
    NURSE VISIT NOTE  Subjective:    Patient ID: Peggy Shaw, female    DOB: 1991-06-02, 32 y.o.   MRN: [id number]  HPI  Patient is a 32 y.o. [phone] female who presents for fetal monitoring per order from Jeannie Fend, MD.   Objective:    BP (!) 104/48   Pulse 71   Ht 5\' 4"  (1.626 m)   Wt 259 lb 9.6 oz (117.8 kg)   LMP 05/12/2022 (Exact Date)   BMI 44.56 kg/m  Estimated Date of Delivery: 02/16/23  [redacted]w[redacted]d  Fetus A Non-Stress Test Interpretation for 02/01/23  Indication: Obesity  Fetal Heart Rate A Mode: External Baseline Rate (A): 135 bpm Variability: Minimal Accelerations: 15 x 15 Decelerations: None Multiple birth?: No  Uterine Activity Mode: Toco Contraction Frequency (min): None  Interpretation (Fetal Testing) Nonstress Test Interpretation: Reactive Overall Impression: Reassuring for gestational age   Assessment:   1. Pregnancy, supervision, high-risk, third trimester   2. Obesity affecting pregnancy, antepartum, unspecified obesity type   3. Uterine size date discrepancy pregnancy, third trimester   4. [redacted] weeks gestation of pregnancy      Plan:   Results reviewed and discussed with patient by  Roberto Scales, CNM.     Otila Kluver, LPN

## 2023-02-01 ENCOUNTER — Encounter: Payer: Self-pay | Admitting: Licensed Practical Nurse

## 2023-02-01 ENCOUNTER — Ambulatory Visit (INDEPENDENT_AMBULATORY_CARE_PROVIDER_SITE_OTHER): Payer: Medicaid Other

## 2023-02-01 ENCOUNTER — Ambulatory Visit (INDEPENDENT_AMBULATORY_CARE_PROVIDER_SITE_OTHER): Payer: Medicaid Other | Admitting: Licensed Practical Nurse

## 2023-02-01 ENCOUNTER — Other Ambulatory Visit: Payer: Medicaid Other

## 2023-02-01 VITALS — BP 104/48 | HR 71 | Ht 64.0 in | Wt 259.6 lb

## 2023-02-01 VITALS — BP 104/48 | HR 71 | Wt 259.6 lb

## 2023-02-01 DIAGNOSIS — Z3A37 37 weeks gestation of pregnancy: Secondary | ICD-10-CM

## 2023-02-01 DIAGNOSIS — O26843 Uterine size-date discrepancy, third trimester: Secondary | ICD-10-CM

## 2023-02-01 DIAGNOSIS — O99210 Obesity complicating pregnancy, unspecified trimester: Secondary | ICD-10-CM

## 2023-02-01 DIAGNOSIS — O99213 Obesity complicating pregnancy, third trimester: Secondary | ICD-10-CM

## 2023-02-01 DIAGNOSIS — E669 Obesity, unspecified: Secondary | ICD-10-CM

## 2023-02-01 DIAGNOSIS — R7301 Impaired fasting glucose: Secondary | ICD-10-CM

## 2023-02-01 DIAGNOSIS — O99891 Other specified diseases and conditions complicating pregnancy: Secondary | ICD-10-CM

## 2023-02-01 DIAGNOSIS — O0993 Supervision of high risk pregnancy, unspecified, third trimester: Secondary | ICD-10-CM

## 2023-02-01 LAB — POCT URINALYSIS DIPSTICK
Bilirubin, UA: NEGATIVE
Blood, UA: NEGATIVE
Glucose, UA: NEGATIVE
Ketones, UA: NEGATIVE
Nitrite, UA: NEGATIVE
Protein, UA: POSITIVE — AB
Spec Grav, UA: 1.015 (ref 1.010–1.025)
Urobilinogen, UA: 1 E.U./dL
pH, UA: 7 (ref 5.0–8.0)

## 2023-02-01 NOTE — Progress Notes (Signed)
Routine Prenatal Care Visit  Subjective  Peggy Shaw is a 32 y.o. [phone] at 7w6dbeing seen today for ongoing prenatal care.  She is currently monitored for the following issues for this high-risk pregnancy and has BMI 40.0-44.9, [place]; Obesity affecting pregnancy, antepartum; IDA (iron deficiency anemia); Smoker 5-7 cpd; Pregnancy, supervision, high-risk, third trimester; Abnormal glucose affecting pregnancy; History of oligohydramnios in prior pregnancy, currently pregnant; Anemia of pregnancy in third trimester; Uterine size date discrepancy pregnancy, third trimester; and [redacted] weeks gestation of pregnancy on their problem list.  ----------------------------------------------------------------------------------- Patient reports some groin/crotch pain  Doing well.  -RNST today has BPP scheduled tomorrow -reviewed IOL around due date, method of induction base don her cervical exam.  -ways to encourage labor reviewed.   Contractions: Not present. Vag. Bleeding: None.  Movement: Present. Leaking Fluid denies.  ----------------------------------------------------------------------------------- The following portions of the patient's history were reviewed and updated as appropriate: allergies, current medications, past family history, past medical history, past social history, past surgical history and problem list. Problem list updated.  Objective  Blood pressure (!) 104/48, pulse 71, weight 259 lb 9.6 oz (117.8 kg), last menstrual period 05/12/2022. Pregravid weight 236 lb (107 kg) Total Weight Gain 23 lb 9.6 oz (10.7 kg) Urinalysis: Urine Protein    Urine Glucose    Fetal Status: Fetal Heart Rate (bpm): 135 Fundal Height: 39 cm Movement: Present     General:  Alert, oriented and cooperative. Patient is in no acute distress.  Skin: Skin is warm and dry. No rash noted.   Cardiovascular: Normal heart rate noted  Respiratory: Normal respiratory effort, no problems with respiration noted   Abdomen: Soft, gravid, appropriate for gestational age. Pain/Pressure: Absent     Pelvic:  Cervical exam performed Dilation: 1.5 Effacement (%): 50 Station: -3  Extremities: Normal range of motion.  Edema: Mild pitting, slight indentation  Mental Status: Normal mood and affect. Normal behavior. Normal judgment and thought content.   Assessment   32y.o. [id number] 322w6dy  02/16/2023, by Last Menstrual Period presenting for routine prenatal visit  Plan   fourth Problems (from 08/08/22 to present)     Problem Noted Resolved   Abnormal glucose affecting pregnancy 08/16/2022 by DoEmily FilbertMD No        Term labor symptoms and general obstetric precautions including but not limited to vaginal bleeding, contractions, leaking of fluid and fetal movement were reviewed in detail with the patient. Please refer to After Visit Summary for other counseling recommendations.   Return in about 1 week (around 02/08/2023) for ROWetzel LyRoberto ScalesCNW[facility]  02/01/23  1:17 PM

## 2023-02-02 ENCOUNTER — Ambulatory Visit
Admission: RE | Admit: 2023-02-02 | Discharge: 2023-02-02 | Disposition: A | Discharge status: Home / Self Care | Payer: Medicaid Other | Source: Ambulatory Visit | Attending: Obstetrics and Gynecology | Admitting: Obstetrics and Gynecology

## 2023-02-02 ENCOUNTER — Telehealth: Payer: Self-pay

## 2023-02-02 DIAGNOSIS — O99210 Obesity complicating pregnancy, unspecified trimester: Secondary | ICD-10-CM | POA: Diagnosis present

## 2023-02-02 NOTE — Telephone Encounter (Signed)
Patient reports she lost her mucus plug this morning. Advised this is normal. Monitor for bloody show, leaking of fluid, contractions. Aware of when to report to L&D.

## 2023-02-03 LAB — URINE CULTURE

## 2023-02-07 ENCOUNTER — Encounter: Payer: Self-pay | Admitting: Licensed Practical Nurse

## 2023-02-08 NOTE — Progress Notes (Unsigned)
    NURSE VISIT NOTE  Subjective:    Patient ID: Peggy Shaw, female    DOB: Apr 22, 1991, 32 y.o.   MRN: [id number]  HPI  Patient is a 32 y.o. [phone] female who presents for fetal monitoring per order from Roberto Scales, [place].   Objective:    LMP 05/12/2022 (Exact Date)  Estimated Date of Delivery: 02/16/23  [redacted]w[redacted]d  Fetus A Non-Stress Test Interpretation for 02/08/23  Indication: Obesity            Assessment:   1. Obesity in pregnancy, antepartum, third trimester      Plan:   Results reviewed and discussed with patient by  Rubie Maid, MD.     Otila Kluver, LPN

## 2023-02-08 NOTE — Patient Instructions (Signed)

## 2023-02-09 ENCOUNTER — Ambulatory Visit (INDEPENDENT_AMBULATORY_CARE_PROVIDER_SITE_OTHER): Payer: Medicaid Other | Admitting: Obstetrics and Gynecology

## 2023-02-09 ENCOUNTER — Ambulatory Visit (INDEPENDENT_AMBULATORY_CARE_PROVIDER_SITE_OTHER): Payer: Medicaid Other

## 2023-02-09 VITALS — BP 103/67 | HR 77 | Ht 64.0 in | Wt 263.0 lb

## 2023-02-09 VITALS — BP 103/67 | HR 77 | Wt 263.0 lb

## 2023-02-09 DIAGNOSIS — O99213 Obesity complicating pregnancy, third trimester: Secondary | ICD-10-CM | POA: Diagnosis not present

## 2023-02-09 DIAGNOSIS — E669 Obesity, unspecified: Secondary | ICD-10-CM

## 2023-02-09 DIAGNOSIS — Z3A39 39 weeks gestation of pregnancy: Secondary | ICD-10-CM

## 2023-02-09 DIAGNOSIS — O0993 Supervision of high risk pregnancy, unspecified, third trimester: Secondary | ICD-10-CM

## 2023-02-09 DIAGNOSIS — O99210 Obesity complicating pregnancy, unspecified trimester: Secondary | ICD-10-CM

## 2023-02-09 LAB — POCT URINALYSIS DIPSTICK OB
Bilirubin, UA: NEGATIVE
Blood, UA: NEGATIVE
Glucose, UA: NEGATIVE
Ketones, UA: NEGATIVE
Leukocytes, UA: NEGATIVE
Nitrite, UA: NEGATIVE
POC,PROTEIN,UA: NEGATIVE
Spec Grav, UA: 1.015 (ref 1.010–1.025)
Urobilinogen, UA: 0.2 E.U./dL
pH, UA: 7 (ref 5.0–8.0)

## 2023-02-09 NOTE — Progress Notes (Signed)
ROB: Patient is a 32 y.o. [id number] at 23w0dwho presents for OB care. Pregnancy complicated by obesity.  NST performed today was reviewed and was found to be reactive.  Continue recommended antenatal testing and prenatal care.  BPP last week noted AFI of 15 cm (patient with h/o oligo last pregnancy). Discussed IOL for BMI, will plan for around 40 weeks, on 02/15/23 at 0500. Discussed IOL in detail. Discussed natural cervical ripening agents.      Fetus A Non-Stress Test Interpretation for 02/09/23  Indication:  Obesity in pregnancy  Fetal Heart Rate A Mode: External Baseline Rate (A): 135 bpm Variability: Moderate Accelerations: 15 x 15 Decelerations: None  Uterine Activity Mode: Toco Contraction Frequency (min): uterine irritability  Interpretation (Fetal Testing) Nonstress Test Interpretation: Reactive Overall Impression: Reassuring for gestational age

## 2023-02-09 NOTE — Progress Notes (Signed)
ROB [redacted]w[redacted]d NST today. She is doing well. She reports good fetal movement with pelvic pressure/pain and swelling of feet. She would like to discuss induction.

## 2023-02-13 ENCOUNTER — Encounter: Payer: Self-pay | Admitting: Obstetrics and Gynecology

## 2023-02-14 ENCOUNTER — Other Ambulatory Visit: Payer: Self-pay | Admitting: Licensed Practical Nurse

## 2023-02-14 DIAGNOSIS — Z3490 Encounter for supervision of normal pregnancy, unspecified, unspecified trimester: Secondary | ICD-10-CM

## 2023-02-15 ENCOUNTER — Inpatient Hospital Stay: Payer: Medicaid Other | Admitting: Anesthesiology

## 2023-02-15 ENCOUNTER — Encounter: Payer: Self-pay | Admitting: Obstetrics and Gynecology

## 2023-02-15 ENCOUNTER — Other Ambulatory Visit: Payer: Self-pay

## 2023-02-15 ENCOUNTER — Inpatient Hospital Stay
Admission: EM | Admit: 2023-02-15 | Discharge: 2023-02-17 | DRG: 806 | Disposition: A | Discharge status: Home / Self Care | Payer: Medicaid Other | Attending: Obstetrics | Admitting: Obstetrics

## 2023-02-15 DIAGNOSIS — D62 Acute posthemorrhagic anemia: Secondary | ICD-10-CM | POA: Diagnosis not present

## 2023-02-15 DIAGNOSIS — O9902 Anemia complicating childbirth: Secondary | ICD-10-CM | POA: Diagnosis present

## 2023-02-15 DIAGNOSIS — Z3490 Encounter for supervision of normal pregnancy, unspecified, unspecified trimester: Secondary | ICD-10-CM | POA: Diagnosis present

## 2023-02-15 DIAGNOSIS — Z3A39 39 weeks gestation of pregnancy: Secondary | ICD-10-CM | POA: Diagnosis not present

## 2023-02-15 DIAGNOSIS — Z87891 Personal history of nicotine dependence: Secondary | ICD-10-CM

## 2023-02-15 DIAGNOSIS — O99214 Obesity complicating childbirth: Principal | ICD-10-CM | POA: Diagnosis present

## 2023-02-15 DIAGNOSIS — O99810 Abnormal glucose complicating pregnancy: Principal | ICD-10-CM

## 2023-02-15 DIAGNOSIS — F17211 Nicotine dependence, cigarettes, in remission: Secondary | ICD-10-CM | POA: Diagnosis not present

## 2023-02-15 DIAGNOSIS — O99334 Smoking (tobacco) complicating childbirth: Secondary | ICD-10-CM | POA: Diagnosis not present

## 2023-02-15 DIAGNOSIS — E663 Overweight: Secondary | ICD-10-CM | POA: Diagnosis not present

## 2023-02-15 DIAGNOSIS — D649 Anemia, unspecified: Secondary | ICD-10-CM | POA: Diagnosis not present

## 2023-02-15 LAB — CBC
HCT: 34.1 % — ABNORMAL LOW (ref 36.0–46.0)
Hemoglobin: 11.1 g/dL — ABNORMAL LOW (ref 12.0–15.0)
MCH: 27.4 pg (ref 26.0–34.0)
MCHC: 32.6 g/dL (ref 30.0–36.0)
MCV: 84.2 fL (ref 80.0–100.0)
Platelets: 222 10*3/uL (ref 150–400)
RBC: 4.05 MIL/uL (ref 3.87–5.11)
RDW: 15.5 % (ref 11.5–15.5)
WBC: 6.1 10*3/uL (ref 4.0–10.5)
nRBC: 0 % (ref 0.0–0.2)

## 2023-02-15 LAB — TYPE AND SCREEN
ABO/RH(D): B POS
Antibody Screen: NEGATIVE

## 2023-02-15 LAB — RPR: RPR Ser Ql: NONREACTIVE

## 2023-02-15 MED ORDER — MISOPROSTOL 50MCG HALF TABLET
50.0000 ug | ORAL_TABLET | Freq: Once | ORAL | Status: AC
  Administered 2023-02-15: 50 ug via ORAL
  Filled 2023-02-15: qty 1

## 2023-02-15 MED ORDER — LIDOCAINE HCL (PF) 1 % IJ SOLN: 30.0000 mL | INTRAMUSCULAR | Status: DC | PRN

## 2023-02-15 MED ORDER — LACTATED RINGERS IV SOLN: INTRAVENOUS | Status: DC

## 2023-02-15 MED ORDER — OXYTOCIN-SODIUM CHLORIDE 30-0.9 UT/500ML-% IV SOLN: 1.0000 m[IU]/min | INTRAVENOUS | Status: DC

## 2023-02-15 MED ORDER — AMMONIA AROMATIC IN INHA
RESPIRATORY_TRACT | Status: AC
  Filled 2023-02-15: qty 10

## 2023-02-15 MED ORDER — OXYTOCIN BOLUS FROM INFUSION
333.0000 mL | Freq: Once | INTRAVENOUS | Status: AC
  Administered 2023-02-16: 333 mL via INTRAVENOUS

## 2023-02-15 MED ORDER — FENTANYL-BUPIVACAINE-NACL 0.5-0.125-0.9 MG/250ML-% EP SOLN
EPIDURAL | Status: AC
  Filled 2023-02-15: qty 250

## 2023-02-15 MED ORDER — MISOPROSTOL 50MCG HALF TABLET
50.0000 ug | ORAL_TABLET | ORAL | Status: DC | PRN
  Administered 2023-02-15 (×2): 50 ug via VAGINAL
  Filled 2023-02-15 (×2): qty 1

## 2023-02-15 MED ORDER — LIDOCAINE HCL (PF) 1 % IJ SOLN
INTRAMUSCULAR | Status: AC
  Filled 2023-02-15: qty 30

## 2023-02-15 MED ORDER — OXYTOCIN 10 UNIT/ML IJ SOLN
INTRAMUSCULAR | Status: AC
  Filled 2023-02-15: qty 2

## 2023-02-15 MED ORDER — LACTATED RINGERS IV SOLN
500.0000 mL | INTRAVENOUS | Status: DC | PRN
  Administered 2023-02-15: 250 mL via INTRAVENOUS
  Administered 2023-02-16: 500 mL via INTRAVENOUS

## 2023-02-15 MED ORDER — HYDROXYZINE HCL 25 MG PO TABS: 50.0000 mg | ORAL_TABLET | Freq: Four times a day (QID) | ORAL | Status: DC | PRN

## 2023-02-15 MED ORDER — MISOPROSTOL 200 MCG PO TABS
ORAL_TABLET | ORAL | Status: AC
  Filled 2023-02-15: qty 4

## 2023-02-15 MED ORDER — OXYTOCIN-SODIUM CHLORIDE 30-0.9 UT/500ML-% IV SOLN
2.5000 [IU]/h | INTRAVENOUS | Status: DC
  Administered 2023-02-16: 2.5 [IU]/h via INTRAVENOUS
  Filled 2023-02-15: qty 500

## 2023-02-15 MED ORDER — SOD CITRATE-CITRIC ACID 500-334 MG/5ML PO SOLN: 30.0000 mL | ORAL | Status: DC | PRN

## 2023-02-15 MED ORDER — TERBUTALINE SULFATE 1 MG/ML IJ SOLN: 0.2500 mg | Freq: Once | INTRAMUSCULAR | Status: DC | PRN

## 2023-02-15 MED ORDER — MISOPROSTOL 25 MCG QUARTER TABLET
25.0000 ug | ORAL_TABLET | Freq: Once | ORAL | Status: AC
  Administered 2023-02-15: 25 ug via VAGINAL
  Filled 2023-02-15: qty 1

## 2023-02-15 MED ORDER — ONDANSETRON HCL 4 MG/2ML IJ SOLN: 4.0000 mg | Freq: Four times a day (QID) | INTRAMUSCULAR | Status: DC | PRN

## 2023-02-15 MED ORDER — OXYTOCIN-SODIUM CHLORIDE 30-0.9 UT/500ML-% IV SOLN
1.0000 m[IU]/min | INTRAVENOUS | Status: DC
  Administered 2023-02-16: 1 m[IU]/min via INTRAVENOUS

## 2023-02-15 MED ORDER — FENTANYL CITRATE (PF) 100 MCG/2ML IJ SOLN
50.0000 ug | INTRAMUSCULAR | Status: DC | PRN
  Administered 2023-02-15: 50 ug via INTRAVENOUS
  Administered 2023-02-15: 100 ug via INTRAVENOUS
  Filled 2023-02-15 (×2): qty 2

## 2023-02-15 NOTE — Progress Notes (Signed)
Peggy Shaw is a 32 y.o. [phone] at [redacted]w[redacted]d by ultrasound admitted for induction of labor due to high BMI and MFM recommendation.This is her third baby girl. Hx of towo previous SVDs, including 8 lb delivery. She was given one dose of Cytotec at approximately 0500.   Subjective: She is aware of contractions , but is not uncomfortable.  She plan to use epidural anesthesia once she is more dilated and uncomffortable.   Objective: BP 108/60 (BP Location: Right Arm)   Pulse 86   Temp 97.9 F (36.6 C) (Oral)   Resp 18   Ht 5\' 4"  (1.626 m)   Wt 119.3 kg   LMP 05/12/2022 (Exact Date)   BMI 45.14 kg/m  No intake/output data recorded. No intake/output data recorded.  FHT:  FHR: 130-135 bpm, variability: moderate,  accelerations:  Present,  decelerations:  Absent UC:   regular, every 3 minutes SVE:   Dilation: 3 Effacement (%): 30 Station: Ballotable Exam by:: Rolly Salter, CNM  Labs: Lab Results  Component Value Date   WBC 6.1 02/15/2023   HGB 11.1 (L) 02/15/2023   HCT 34.1 (L) 02/15/2023   MCV 84.2 02/15/2023   PLT 222 02/15/2023    Assessment / Plan: Induction of labor at term, responding well to cytotec  Labor: Progressing normally  Fetal Wellbeing:  Category I Pain Control:  Labor support without medications I/D:  n/a Anticipated MOD:  NSVD  Imagene Riches, CNM 02/15/2023, 9:23 AM

## 2023-02-15 NOTE — Progress Notes (Signed)
Peggy Shaw is a 32 y.o. [phone] at [redacted]w[redacted]d who continues with IOL at term.    Subjective: She has used the labor ball, changed positions in the bed, and reports feeling stronger contractions. She has received one dose of IV Fentanyl.   Objective: BP (!) 122/52   Pulse 74   Temp 97.9 F (36.6 C) (Oral)   Resp 18   Ht 5\' 4"  (1.626 m)   Wt 119.3 kg   LMP 05/12/2022 (Exact Date)   BMI 45.14 kg/m  No intake/output data recorded. No intake/output data recorded.  FHT: some difficulty tracing FHTs due to body habitus;  FHR: 140 baseline bpm, variability: minimal to moderate depending on patient's position;,  accelerations:  Present,  decelerations:  Absent UC:   regular, every 2-4 minutes SVE:   Dilation: 3.5 Effacement (%): 60 Station: -3 Exam by:: Rolly Salter CNM Bishop score: 5  Labs: Lab Results  Component Value Date   WBC 6.1 02/15/2023   HGB 11.1 (L) 02/15/2023   HCT 34.1 (L) 02/15/2023   MCV 84.2 02/15/2023   PLT 222 02/15/2023    Assessment / Plan: Cervical ripening.  Discussed in depth with patient, the option of Cook catheter placement to increase cervical dilation  Labor: Progressing normally  Fetal Wellbeing:  Category II Pain Control:  IV pain meds I/D:  n/a Anticipated MOD:  NSVD  Pateint expresses wilingness to have catheter placed. Aslo requests another dose of IV Fentanyl. Fentanyl given; Cook catheter placed; inflated with 60 cc in each "balloon" compartment. Continue to monitor FHTs carefully.  Imagene Riches, CNM  02/15/2023 10:32 PM    Imagene Riches, CNM 02/15/2023, 10:19 PM

## 2023-02-15 NOTE — Progress Notes (Signed)
Peggy Shaw is a 32 y.o. [phone] at [redacted]w[redacted]d by ultrasound admitted for induction of labor due to High BMI at term..  Subjective: she reports that the cramping is now definitely stronger.   Objective: BP 116/73 (BP Location: Left Arm)   Pulse 74   Temp 98.2 F (36.8 C) (Oral)   Resp 18   Ht 5\' 4"  (1.626 m)   Wt 119.3 kg   LMP 05/12/2022 (Exact Date)   BMI 45.14 kg/m  No intake/output data recorded. No intake/output data recorded.  FHT:  FHR: 145 bpm, variability: moderate,  accelerations:  Present,  decelerations:  Absent UC:   regular, every 2-3.5 minutes SVE:   Dilation: 3.5 Effacement (%): 50 Station: Ballotable Exam by:: Rolly Salter, CNM  Labs: Lab Results  Component Value Date   WBC 6.1 02/15/2023   HGB 11.1 (L) 02/15/2023   HCT 34.1 (L) 02/15/2023   MCV 84.2 02/15/2023   PLT 222 02/15/2023    Assessment / Plan: Induction of labor due to obesity  Labor: Progressing normally will recheck in several hours and likely commence wth pitocin.  Fetal Wellbeing:  Category I Pain Control:  Labor support without medications I/D:  n/a Anticipated MOD:  NSVD  Imagene Riches, CNM 02/15/2023, 2:58 PM

## 2023-02-15 NOTE — Progress Notes (Signed)
Peggy Shaw is a 32 y.o. [phone] at [redacted]w[redacted]d by  admitted for induction of labor  Subjective: She is uncomfortable and her Peggy Shaw catheter just fell out. She would like an epidural.   Objective: BP (!) 122/52   Pulse 74   Temp 97.9 F (36.6 C) (Oral)   Resp 18   Ht 5\' 4"  (1.626 m)   Wt 119.3 kg   LMP 05/12/2022 (Exact Date)   BMI 45.14 kg/m  No intake/output data recorded. No intake/output data recorded.  FHT:  FHR: 145 bpm, variability: moderate,  accelerations:  Present,  decelerations:  Present rare brief decels UC:   regular, every 1.5 minutes SVE:   Dilation: 5 Effacement (%): 60 Station: -3 Exam by:: Peggy Shaw CNM  Labs: Lab Results  Component Value Date   WBC 6.1 02/15/2023   HGB 11.1 (L) 02/15/2023   HCT 34.1 (L) 02/15/2023   MCV 84.2 02/15/2023   PLT 222 02/15/2023    Assessment / Plan: Induction of labor- + cervical change. [place] catheter is now out.   Labor: Progressing normally. Will start pitocin after epidural placed.  Fetal Wellbeing:  Category I Pain Control:   anethesia contacted for epidural placement. I/D:  n/a Anticipated MOD:  NSVD  Peggy Shaw, CNM 02/15/2023, 11:33 PM

## 2023-02-15 NOTE — H&P (Signed)
OB History & Physical   History of Present Illness:  Chief Complaint:   HPI:  Peggy Shaw is a 32 y.o. [phone] female at [redacted]w[redacted]d dated by L and 12wkUS.  She presents to L&D for IOL secondary to high BMI. She endorses +FM, denies contractions denies any LOF/VB. Peggy Shaw has had early and regular prenatal care. Her [place] on 3/7 showed an AFI of 15.4 and EFW of 3098 grams, 55'th%.   Reviewed methods of induction. Pt unsure about ripening balloon placement as vaginal exams are uncomfortable. Would prefer to wait for her partner to arrive for support.  Fentanyl offered for ripening balloon  placement.   Pregnancy Issues: 1. BMI 45.14, TWG 27lbs 2. Anemia requiring Iron Infusions 3. Tobacco use, stopped in beginning of pregnancy   Maternal Medical History:   Past Medical History:  Diagnosis Date   Abnormal uterine bleeding    Migraines     Past Surgical History:  Procedure Laterality Date   HAND SURGERY Right 2015   PERIPHERAL VASCULAR THROMBECTOMY Right 2013   removed from right hand   [place]   four    No Known Allergies  Prior to Admission medications   Medication Sig Start Date End Date Taking? Authorizing Provider  Prenatal MV & Min w/FA-DHA (PRENATAL GUMMIES) 0.18-25 MG CHEW Chew 1 tablet by mouth daily. 12/19/22   Imagene Riches, CNM     Prenatal care site:[facility]   Social History: She  reports that she quit smoking about 4 years ago. Her smoking use included cigarettes. She smoked an average of .25 packs per day. She has never used smokeless tobacco. She reports that she does not currently use alcohol after a past usage of about 3.0 standard drinks of alcohol per week. She reports that she does not use drugs.  Family History: family history includes Lung cancer in her maternal aunt; Seizures in her sister.   Review of Systems: A full review of systems was performed and negative except as noted in the HPI.     Physical Exam:  Vital  Signs: BP 108/60 (BP Location: Right Arm)   Pulse 86   Temp 97.9 F (36.6 C) (Oral)   Resp 18   Ht 5\' 4"  (1.626 m)   Wt 119.3 kg   LMP 05/12/2022 (Exact Date)   BMI 45.14 kg/m  General: no acute distress.  HEENT: normocephalic, atraumatic Heart: regular rate & rhythm.  No murmurs/rubs/gallops Lungs: clear to auscultation bilaterally, normal respiratory effort Abdomen: soft, gravid, non-tender;  EFW: 7.5lbs Pelvic:   External: Normal external female genitalia  Cervix: Dilation: 1.5 / Effacement (%): 30 / Station: -3    Extremities: non-tender, symmetric, NO edema bilaterally.    Neurologic: Alert & oriented x 3.    No results found for this or any previous visit (from the past 24 hour(s)).  Pertinent Results:  Prenatal Labs: Blood type/Rh B+   Antibody screen neg  Rubella Non Immune  Varicella Immune  RPR NR  HBsAg Neg  HIV NR  GC neg  Chlamydia neg  Genetic screening negative  1 hour GTT 109  3 hour GTT   GBS Negative   TDAP 12/01/2022 Flu declined    FHT: baseline 125, moderate variability, pos accel, neg decel  TOCO:irregular  SVE:  Dilation: 1.5 / Effacement (%): 30 / Station: -3    Cephalic by leopolds  US OB Follow Up  Result Date: 02/02/2023 CLINICAL DATA:  Evaluate interval growth and biophysical  profile EXAM: OBSTETRIC 14+ WK ULTRASOUND FOLLOW-UP AND BIOPHYSICAL PROFILE FINDINGS: Number of Fetuses: 1 Heart Rate:  130 bpm Movement: Seen Presentation: Cephalic Previa: No Placental Location: Fundal Amniotic Fluid (Subjective): Normal Amniotic Fluid (Objective): AFI 15.4 cm (5%ile= 7.3 cm, 95%= 23.9 cm for 38 wks) FETAL BIOMETRY BPD:  9.23cm 37w 3d HC:    32.54cm 36w 6d AC:    33.49cm 37w 3d FL:    7.01cm 36w 0d Current Mean GA: 36w 5d [place] EDC: 02/25/2023 Assigned GA: 38w 0d Assigned EDC: 02/16/2023 Estimated Fetal Weight:  3098+/- 457g 55%ile FETAL ANATOMY Lateral Ventricles: Appears normal Thalami/CSP: Previously seen Posterior Fossa: Previously seen Nuchal  Region: Previously seen Upper Lip: Previously seen Spine: Previously seen 4 Chamber Heart on Left: Appears normal LVOT: Appears normal RVOT: Previously seen Stomach on Left: Appears normal 3 Vessel Cord: Previously seen Cord Insertion site: Previously seen Kidneys: Previously seen Bladder: Appears normal Extremities: Previously seen Sex: Previously seen Technical Limitations: Maternal body habitus, advanced gestational age Maternal Findings: Cervix:  Cervix is closed measuring 3.9 cm BIOPHYSICAL PROFILE Movement: 2 time: 10 minutes Breathing: 2 Tone:  2 Amniotic Fluid: 2 Total Score:  8 IMPRESSION: Single live intrauterine pregnancy seen. Sonographically estimated gestational age is 81 weeks 5 days. Biophysical profile score is 8 out of 8. Other findings as described in the body of the report. Electronically Signed   By: Elmer Picker M.D.   On: 02/02/2023 08:58   US FETAL BPP WO NON STRESS  Result Date: 02/02/2023 CLINICAL DATA:  Evaluate interval growth and biophysical profile EXAM: OBSTETRIC 14+ WK ULTRASOUND FOLLOW-UP AND BIOPHYSICAL PROFILE FINDINGS: Number of Fetuses: 1 Heart Rate:  130 bpm Movement: Seen Presentation: Cephalic Previa: No Placental Location: Fundal Amniotic Fluid (Subjective): Normal Amniotic Fluid (Objective): AFI 15.4 cm (5%ile= 7.3 cm, 95%= 23.9 cm for 38 wks) FETAL BIOMETRY BPD:  9.23cm 37w 3d HC:    32.54cm 36w 6d AC:    33.49cm 37w 3d FL:    7.01cm 36w 0d Current Mean GA: 36w 5d [place] EDC: 02/25/2023 Assigned GA: 38w 0d Assigned EDC: 02/16/2023 Estimated Fetal Weight:  3098+/- 457g 55%ile FETAL ANATOMY Lateral Ventricles: Appears normal Thalami/CSP: Previously seen Posterior Fossa: Previously seen Nuchal Region: Previously seen Upper Lip: Previously seen Spine: Previously seen 4 Chamber Heart on Left: Appears normal LVOT: Appears normal RVOT: Previously seen Stomach on Left: Appears normal 3 Vessel Cord: Previously seen Cord Insertion site: Previously seen Kidneys: Previously seen  Bladder: Appears normal Extremities: Previously seen Sex: Previously seen Technical Limitations: Maternal body habitus, advanced gestational age Maternal Findings: Cervix:  Cervix is closed measuring 3.9 cm BIOPHYSICAL PROFILE Movement: 2 time: 10 minutes Breathing: 2 Tone:  2 Amniotic Fluid: 2 Total Score:  8 IMPRESSION: Single live intrauterine pregnancy seen. Sonographically estimated gestational age is 71 weeks 5 days. Biophysical profile score is 8 out of 8. Other findings as described in the body of the report. Electronically Signed   By: Elmer Picker M.D.   On: 02/02/2023 08:58    Assessment:  ROSILAND YOUNGSTROM is a 32 y.o. [phone] female at [redacted]w[redacted]d with IOL secondary to high bMI.   Plan:  Admit to Labor & Delivery, Cytotec placed at 0609, consider ripening balloon placement once her partner is here.  CBC, T&S, Clrs, IVF GBS  negative, membranes intact  Consents obtained. Continuous efm/toco Pain management: planning epidural   ----- Roberto Scales, [place]

## 2023-02-16 ENCOUNTER — Other Ambulatory Visit: Payer: Medicaid Other

## 2023-02-16 ENCOUNTER — Encounter: Payer: Medicaid Other | Admitting: Obstetrics and Gynecology

## 2023-02-16 DIAGNOSIS — E663 Overweight: Secondary | ICD-10-CM

## 2023-02-16 DIAGNOSIS — O99214 Obesity complicating childbirth: Secondary | ICD-10-CM

## 2023-02-16 DIAGNOSIS — O9902 Anemia complicating childbirth: Secondary | ICD-10-CM

## 2023-02-16 DIAGNOSIS — F17211 Nicotine dependence, cigarettes, in remission: Secondary | ICD-10-CM

## 2023-02-16 DIAGNOSIS — O99334 Smoking (tobacco) complicating childbirth: Secondary | ICD-10-CM

## 2023-02-16 DIAGNOSIS — D649 Anemia, unspecified: Secondary | ICD-10-CM

## 2023-02-16 DIAGNOSIS — Z3A39 39 weeks gestation of pregnancy: Secondary | ICD-10-CM

## 2023-02-16 MED ORDER — EPHEDRINE 5 MG/ML INJ: 10.0000 mg | INTRAVENOUS | Status: DC | PRN

## 2023-02-16 MED ORDER — LIDOCAINE HCL (PF) 1 % IJ SOLN
INTRAMUSCULAR | Status: DC | PRN
  Administered 2023-02-15: 3 mL

## 2023-02-16 MED ORDER — WITCH HAZEL-GLYCERIN EX PADS
1.0000 | MEDICATED_PAD | CUTANEOUS | Status: DC | PRN
  Administered 2023-02-16: 1 via TOPICAL
  Filled 2023-02-16 (×2): qty 100

## 2023-02-16 MED ORDER — PHENYLEPHRINE 80 MCG/ML (10ML) SYRINGE FOR IV PUSH (FOR BLOOD PRESSURE SUPPORT)
80.0000 ug | PREFILLED_SYRINGE | INTRAVENOUS | Status: DC | PRN
  Filled 2023-02-16: qty 10

## 2023-02-16 MED ORDER — LACTATED RINGERS IV SOLN: 500.0000 mL | Freq: Once | INTRAVENOUS | Status: DC

## 2023-02-16 MED ORDER — FENTANYL-BUPIVACAINE-NACL 0.5-0.125-0.9 MG/250ML-% EP SOLN
12.0000 mL/h | EPIDURAL | Status: DC | PRN
  Administered 2023-02-15: 12 mL/h via EPIDURAL

## 2023-02-16 MED ORDER — ACETAMINOPHEN 325 MG PO TABS
650.0000 mg | ORAL_TABLET | ORAL | Status: DC | PRN
  Administered 2023-02-16: 650 mg via ORAL
  Filled 2023-02-16: qty 2

## 2023-02-16 MED ORDER — PHENYLEPHRINE 80 MCG/ML (10ML) SYRINGE FOR IV PUSH (FOR BLOOD PRESSURE SUPPORT): 80.0000 ug | PREFILLED_SYRINGE | INTRAVENOUS | Status: DC | PRN

## 2023-02-16 MED ORDER — LIDOCAINE-EPINEPHRINE (PF) 1.5 %-1:200000 IJ SOLN
INTRAMUSCULAR | Status: DC | PRN
  Administered 2023-02-15: 3 mL via EPIDURAL
  Administered 2023-02-15: 2 mL via EPIDURAL

## 2023-02-16 MED ORDER — IBUPROFEN 600 MG PO TABS
600.0000 mg | ORAL_TABLET | Freq: Four times a day (QID) | ORAL | Status: DC
  Administered 2023-02-16 – 2023-02-17 (×4): 600 mg via ORAL
  Filled 2023-02-16 (×4): qty 1

## 2023-02-16 MED ORDER — PRENATAL MULTIVITAMIN CH
1.0000 | ORAL_TABLET | Freq: Every day | ORAL | Status: DC
  Administered 2023-02-16: 1 via ORAL
  Filled 2023-02-16: qty 1

## 2023-02-16 MED ORDER — SIMETHICONE 80 MG PO CHEW: 80.0000 mg | CHEWABLE_TABLET | ORAL | Status: DC | PRN

## 2023-02-16 MED ORDER — DIBUCAINE (PERIANAL) 1 % EX OINT
1.0000 | TOPICAL_OINTMENT | CUTANEOUS | Status: DC | PRN
  Administered 2023-02-16: 1 via RECTAL
  Filled 2023-02-16: qty 28

## 2023-02-16 MED ORDER — ONDANSETRON HCL 4 MG PO TABS: 4.0000 mg | ORAL_TABLET | ORAL | Status: DC | PRN

## 2023-02-16 MED ORDER — DIPHENHYDRAMINE HCL 50 MG/ML IJ SOLN: 12.5000 mg | INTRAMUSCULAR | Status: DC | PRN

## 2023-02-16 MED ORDER — DOCUSATE SODIUM 100 MG PO CAPS
100.0000 mg | ORAL_CAPSULE | Freq: Two times a day (BID) | ORAL | Status: DC
  Administered 2023-02-17: 100 mg via ORAL
  Filled 2023-02-16: qty 1

## 2023-02-16 MED ORDER — ZOLPIDEM TARTRATE 5 MG PO TABS: 5.0000 mg | ORAL_TABLET | Freq: Every evening | ORAL | Status: DC | PRN

## 2023-02-16 MED ORDER — COCONUT OIL OIL: 1.0000 | TOPICAL_OIL | Status: DC | PRN

## 2023-02-16 MED ORDER — BENZOCAINE-MENTHOL 20-0.5 % EX AERO
1.0000 | INHALATION_SPRAY | CUTANEOUS | Status: DC | PRN
  Administered 2023-02-16: 1 via TOPICAL
  Filled 2023-02-16 (×2): qty 56

## 2023-02-16 MED ORDER — ONDANSETRON HCL 4 MG/2ML IJ SOLN: 4.0000 mg | INTRAMUSCULAR | Status: DC | PRN

## 2023-02-16 MED ORDER — DIPHENHYDRAMINE HCL 25 MG PO CAPS: 25.0000 mg | ORAL_CAPSULE | Freq: Four times a day (QID) | ORAL | Status: DC | PRN

## 2023-02-16 NOTE — Anesthesia Postprocedure Evaluation (Signed)
Anesthesia Post Note  Patient: Peggy Shaw  Procedure(s) Performed: AN AD [place]  Patient location during evaluation: Mother Baby Anesthesia Type: Epidural Level of consciousness: oriented and awake and alert Pain management: pain level controlled Vital Signs Assessment: post-procedure vital signs reviewed and stable Respiratory status: spontaneous breathing and respiratory function stable Cardiovascular status: blood pressure returned to baseline and stable Postop Assessment: no headache, no backache, no apparent nausea or vomiting and able to ambulate Anesthetic complications: no  No notable events documented.   Last Vitals:  Vitals:   02/16/23 0910 02/16/23 1010  BP: (!) 118/47 (!) 109/52  Pulse: 72 66  Resp: 16 16  Temp: 36.7 C 36.9 C  SpO2: 99% 99%    Last Pain:  Vitals:   02/16/23 1010  TempSrc: Oral  PainSc:                  Alison Stalling

## 2023-02-16 NOTE — Anesthesia Preprocedure Evaluation (Incomplete)
Anesthesia Evaluation  Patient identified by MRN, date of birth, ID band Patient awake    Reviewed: Allergy & Precautions, H&P , NPO status , Patient's Chart, lab work & pertinent test results  Airway Mallampati: II  TM Distance: >3 FB Neck ROM: full    Dental no notable dental hx.    Pulmonary neg pulmonary ROS, former smoker   Pulmonary exam normal        Cardiovascular Exercise Tolerance: Good negative cardio ROS Normal cardiovascular exam     Neuro/Psych    GI/Hepatic negative GI ROS,,,  Endo/Other    Morbid obesity  Renal/GU   negative genitourinary   Musculoskeletal   Abdominal   Peds  Hematology negative hematology ROS (+)   Anesthesia Other Findings Past Medical History: No date: Abnormal uterine bleeding No date: Migraines  Past Surgical History: 2015: HAND SURGERY; Right 2013: PERIPHERAL VASCULAR THROMBECTOMY; Right     Comment:  removed from right hand 2020: WISDOM TOOTH EXTRACTION     Comment:  four  BMI    Body Mass Index: 45.14 kg/m      Reproductive/Obstetrics (+) Pregnancy                              Anesthesia Physical Anesthesia Plan  ASA: 2  Anesthesia Plan: Epidural   Post-op Pain Management:    Induction:   PONV Risk Score and Plan:   Airway Management Planned:   Additional Equipment:   Intra-op Plan:   Post-operative Plan:   Informed Consent: I have reviewed the patients History and Physical, chart, labs and discussed the procedure including the risks, benefits and alternatives for the proposed anesthesia with the patient or authorized representative who has indicated his/her understanding and acceptance.       Plan Discussed with: Anesthesiologist and CRNA  Anesthesia Plan Comments:          Anesthesia Quick Evaluation

## 2023-02-16 NOTE — Progress Notes (Signed)
Contacted by the nursing staff to evaluate the FHTs post epidural placement. The pateint received an epidural around midnight, and within the the next hour begaan to demonstrate a Category 2 strip. CNM in to check patient.  Subjective: She is very sleepy and dozes on and off during this evaluation. Unable to move her legs without assistance, and has needed complete assistnane to change poistions. Very comfortable.   Objective: BP (!) 96/45   Pulse 61   Temp 98.1 F (36.7 C) (Oral)   Resp 18   Ht 5\' 4"  (1.626 m)   Wt 119.3 kg   LMP 05/12/2022 (Exact Date)   SpO2 100%   BMI 45.14 kg/m  No intake/output data recorded. Total I/O In: -  Out: 750 [Urine:750]  FHT:  FHR: 145 baseline  bpm, variability: moderate,  Beginning at approximately 0050 , tracing demonstrates a period of repetetive decels that appear to be late with some dropping to the 90s  and return to baseline of 145. Category 2 tracing. The patient's blood pressures at this time (post epidural0 were low as well, and  the pateint was repositioned from side to side several times. IV bolus was ordered and  this provider rechecked the patient. UC:   regular, every 1.5-2 minutes, moderate strength. SVE:   Dilation: 6.5 Effacement (%): 80 Station: -2 Exam by:: Cox RN  After numerous position changes, IV bolus and several vaginal exams, the FHTs returned to 145-150 baseline. Moderate variability retained throughout, and the decels resolved  Labs: Lab Results  Component Value Date   WBC 6.1 02/15/2023   HGB 11.1 (L) 02/15/2023   HCT 34.1 (L) 02/15/2023   MCV 84.2 02/15/2023   PLT 222 02/15/2023    Assessment / Plan:  Will hold on initiating pitocin for now. EFM strip reviewed with the patient. As the FHTs are now improved, will allow a period of time to decrease the contraction frequency.  Recheck in several hours. Will plan to AROM and place IUPC, FSE based on the EFM tracing at that time.  Imagene Riches, CNM   02/16/2023 5:36 AM    Imagene Riches, CNM 02/16/2023, 5:24 AM

## 2023-02-16 NOTE — Anesthesia Preprocedure Evaluation (Signed)
Anesthesia Evaluation  Patient identified by MRN, date of birth, ID band Patient awake    Reviewed: Allergy & Precautions, H&P , NPO status , Patient's Chart, lab work & pertinent test results  Airway Mallampati: III  TM Distance: >3 FB Neck ROM: full    Dental no notable dental hx.    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; Right     Comment:  removed from right hand 2020: WISDOM TOOTH EXTRACTION     Comment:  four  BMI    Body Mass Index: 45.14 kg/m      Reproductive/Obstetrics (+) Pregnancy                             Anesthesia Physical Anesthesia Plan  ASA: 3  Anesthesia Plan: Epidural   Post-op Pain Management:    Induction:   PONV Risk Score and Plan:   Airway Management Planned:   Additional Equipment:   Intra-op Plan:   Post-operative Plan:   Informed Consent: I have reviewed the patients History and Physical, chart, labs and discussed the procedure including the risks, benefits and alternatives for the proposed anesthesia with the patient or authorized representative who has indicated his/her understanding and acceptance.       Plan Discussed with: Anesthesiologist and CRNA  Anesthesia Plan Comments:         Anesthesia Quick Evaluation

## 2023-02-16 NOTE — Progress Notes (Signed)
Called for lower blood pressures. No motor block reported. No patient complaints or symptoms of SOB. 750 IVF already given. I advised to turn on left side while lower epidural rate to 10cc/hr.  Daron Offer, MD ANES

## 2023-02-16 NOTE — Anesthesia Procedure Notes (Addendum)
Epidural Patient location during procedure: OB Start time: 02/15/2023 11:39 PM End time: 02/15/2023 11:58 PM  Staffing Anesthesiologist: Iran Ouch, MD Performed: anesthesiologist   Preanesthetic Checklist Completed: patient identified, IV checked, site marked, risks and benefits discussed, surgical consent, monitors and equipment checked, pre-op evaluation and timeout performed  Epidural Patient position: sitting Prep: ChloraPrep Patient monitoring: heart rate, continuous pulse ox and blood pressure Approach: midline Location: L3-L4 Injection technique: LOR saline  Needle:  Needle type: Tuohy  Needle gauge: 18 G Needle length: 9 cm Needle insertion depth: 8 cm Catheter type: closed end Catheter size: 20 Guage Catheter at skin depth: 13 cm Test dose: negative and 1.5% lidocaine with Epi 1:200 K  Assessment Events: blood not aspirated, no cerebrospinal fluid, injection not painful, no injection resistance and paresthesia  Additional Notes Paresthesia with initial access to dural space on L>R feet for very short duration. Catheter was inserted easily with no paresthesia. Test dose caused mild bilateral feet paresthesia that quickly resolved. One more additional bolus of 2 cc was easy to inject and did not cause paresthesia. Reason for block:procedure for pain

## 2023-02-16 NOTE — Discharge Summary (Signed)
Postpartum Discharge Summary  Date of Service updated 02/17/2023     Patient Name: Peggy Shaw DOB: 1991-03-23 MRN: [id number]  Date of admission: 02/15/2023 Delivery date:02/16/2023  Delivering provider: Imagene Riches  Date of discharge: 02/17/2023  Admitting diagnosis: Encounter for induction of labor [Z34.90] Intrauterine pregnancy: [redacted]w[redacted]d     Secondary diagnosis:  Active Problems:   Postpartum care following vaginal delivery  Additional problems: none    Discharge diagnosis: Term Pregnancy Delivered                                              Post partum procedures: NA Augmentation: Pitocin, Cytotec, and IP Foley Complications: None  Hospital course: Induction of Labor With Vaginal Delivery   32 y.o. yo [id number] at [redacted]w[redacted]d was admitted to the hospital 02/15/2023 for induction of labor.  Indication for induction: Favorable cervix at term and high BMI .  Patient had an labor course complicated bya period of decreased variability and decels that were resolved with position changes and IV bolus. Membrane Rupture Time/Date: 6:30 AM ,02/16/2023   Delivery Method:Vaginal, Spontaneous  Episiotomy: None  Lacerations:  None  Details of delivery can be found in separate delivery note.  Patient had a postpartum course complicated by NA. Ambulating and voiding without difficulty. Appetite is good. Mood is good. Formula feeding. FOB and her mother are available for PP support. Patient is discharged home 02/17/23.  Newborn Data: Birth date:02/16/2023  Birth time:6:31 AM  Gender:Female  Living status:Living  Apgars:2 ,9  Weight:3090 g   Magnesium Sulfate received: No BMZ received: No Rhophylac:N/A MMR:No T-DaP:Given prenatally Flu: No Transfusion:No  Physical exam  Vitals:   02/16/23 2000 02/17/23 0000 02/17/23 0310 02/17/23 0905  BP: 120/64 (!) 98/51 (!) 110/57 (!) 91/59  Pulse: 65 64 61 60  Resp: 18 17 17 17   Temp: 98.6 F (37 C) 97.8 F (36.6 C) 97.9 F (36.6 C)  98.4 F (36.9 C)  TempSrc: Oral Oral Oral Oral  SpO2: 100% 100% 98% 100%  Weight:      Height:       General: alert Lochia: appropriate Uterine Fundus: firm Incision: N/A DVT Evaluation: No evidence of DVT seen on physical exam. Trace BLE  Labs: Lab Results  Component Value Date   WBC 6.9 02/17/2023   HGB 10.0 (L) 02/17/2023   HCT 31.1 (L) 02/17/2023   MCV 86.4 02/17/2023   PLT 182 02/17/2023      Latest Ref Rng & Units 08/15/2022    9:13 AM  CMP  Glucose 70 - 99 mg/dL 82   BUN 6 - 20 mg/dL 7   Creatinine 0.57 - 1.00 mg/dL 0.56   Sodium 134 - 144 mmol/L 139   Potassium 3.5 - 5.2 mmol/L 3.5   Chloride 96 - 106 mmol/L 103   CO2 20 - 29 mmol/L 23   Calcium 8.7 - 10.2 mg/dL 9.1   Total Protein 6.0 - 8.5 g/dL 6.0   Total Bilirubin 0.0 - 1.2 mg/dL <0.2   Alkaline Phos 44 - 121 IU/L 70   AST 0 - 40 IU/L 10   ALT 0 - 32 IU/L 9    Edinburgh Score:    02/16/2023    5:55 PM  Edinburgh Postnatal Depression Scale Screening Tool  I have been able to laugh and see the funny side of things. 0  I have looked forward with enjoyment to things. 0  I have blamed myself unnecessarily when things went wrong. 0  I have been anxious or worried for no good reason. 2  I have felt scared or panicky for no good reason. 0  Things have been getting on top of me. 0  I have been so unhappy that I have had difficulty sleeping. 0  I have felt sad or miserable. 0  I have been so unhappy that I have been crying. 0  The thought of harming myself has occurred to me. 0  Edinburgh Postnatal Depression Scale Total 2      After visit meds:  Allergies as of 02/17/2023   No Known Allergies      Medication List     STOP taking these medications    Prenatal Gummies 0.18-25 MG Chew       TAKE these medications    acetaminophen 325 MG tablet Commonly known as: Tylenol Take 2 tablets (650 mg total) by mouth every 6 (six) hours as needed (for pain scale < 4).   ibuprofen 600 MG  tablet Commonly known as: ADVIL Take 1 tablet (600 mg total) by mouth every 6 (six) hours as needed.               Discharge Care Instructions  (From admission, onward)           Start     Ordered   02/17/23 0000  Discharge wound care:       Comments: SHOWER DAILY Wash incision gently with soap and water.  Call office with any drainage, redness, or firmness of the incision.   02/17/23 0946             Discharge home in stable condition Infant Feeding: Bottle Infant Disposition:home with mother Discharge instruction: per After Visit Summary and Postpartum booklet. Activity: Advance as tolerated. Pelvic rest for 6 weeks.  Diet: routine diet Anticipated Birth Control: IUD and OCPs- needs to decide, maybe interested in non hormonal methods or tubal.  Postpartum Appointment:6 weeks Additional Postpartum F/U: Postpartum Depression checkup Future Appointments: Future Appointments  Date Time Provider [place]  03/10/2023 10:35 AM Rubie Maid, MD [facility] None  03/31/2023 11:00 AM [facility] None  04/05/2023  2:15 PM Earlie Server, MD [facility] None  04/05/2023  2:30 PM [facility] INFUSION CHAIR 5 [facility] None   Follow up Visit:  Follow-up Information     [facility]. Schedule an appointment as soon as possible for a visit in 6 week(s).   Why: Please make an appointment  for a virtual video visit at 2 weeks post delivery, and another appointment for a 6 week post delivery physical at [facility] to start on birth control. Contact information: [phone] [place] [id number] [phone]                    02/17/2023 Jillene Bucks [facility], CNM

## 2023-02-17 LAB — CBC
HCT: 31.1 % — ABNORMAL LOW (ref 36.0–46.0)
Hemoglobin: 10 g/dL — ABNORMAL LOW (ref 12.0–15.0)
MCH: 27.8 pg (ref 26.0–34.0)
MCHC: 32.2 g/dL (ref 30.0–36.0)
MCV: 86.4 fL (ref 80.0–100.0)
Platelets: 182 10*3/uL (ref 150–400)
RBC: 3.6 MIL/uL — ABNORMAL LOW (ref 3.87–5.11)
RDW: 15.7 % — ABNORMAL HIGH (ref 11.5–15.5)
WBC: 6.9 10*3/uL (ref 4.0–10.5)
nRBC: 0 % (ref 0.0–0.2)

## 2023-02-17 MED ORDER — ACETAMINOPHEN 325 MG PO TABS: 650.0000 mg | ORAL_TABLET | Freq: Four times a day (QID) | ORAL | Status: DC | PRN

## 2023-02-17 MED ORDER — IBUPROFEN 600 MG PO TABS: 600.0000 mg | ORAL_TABLET | Freq: Four times a day (QID) | ORAL | 0 refills | Status: DC | PRN

## 2023-02-17 NOTE — Discharge Instructions (Signed)
Discharge instructions:   Call office if you have any of the following: headache, visual changes, fever >101 F, chills, breast concerns, excessive vaginal bleeding, leg pain or redness, depression or any other concerns.   Activity: Do not lift > 20 lbs for 6 weeks.  No intercourse or tampons for 6 weeks.  No driving for 1-2 weeks.   Call your doctor for increased pain or vaginal bleeding, temperature above 101.0, depression, or concerns.  No strenuous activity or heavy lifting for 6 weeks.  No intercourse, tampons, douching, or enemas for 6 weeks.  Tub baths and showers are ok.  No driving for 2 weeks or while taking pain medications.  Continue prenatal vitamin and iron.  Increase calories and fluids while breastfeeding.  For concerns about your baby please call the pediatrician For breastfeeding issues please remember to call our lactation consultants. 

## 2023-02-17 NOTE — Progress Notes (Signed)
Patient discharged. Discharge instructions given. Patient verbalizes understanding. Transported by RN. 

## 2023-02-17 NOTE — Progress Notes (Signed)
Obstetric Postpartum Daily Progress Note Subjective:  32 y.o. [id number] postpartum day #1 status post vaginal delivery.  She is ambulating, is tolerating po, is voiding spontaneously.  Her pain is well controlled on PO pain medications. Her lochia is less than menses. Mood is good. Formula feeding. Her mother and the FOB will be available for PP support.    Medications SCHEDULED MEDICATIONS   docusate sodium  100 mg Oral BID   ibuprofen  600 mg Oral Q6H   prenatal multivitamin  1 tablet Oral Q1200    MEDICATION INFUSIONS    PRN MEDICATIONS  acetaminophen, benzocaine-Menthol, coconut oil, witch hazel-glycerin **AND** dibucaine, diphenhydrAMINE, ondansetron **OR** ondansetron (ZOFRAN) IV, simethicone, zolpidem    Objective:   Vitals:   02/16/23 1355 02/16/23 2000 02/17/23 0000 02/17/23 0310  BP: (!) 116/58 120/64 (!) 98/51 (!) 110/57  Pulse: 66 65 64 61  Resp: 17 18 17 17   Temp: 98.9 F (37.2 C) 98.6 F (37 C) 97.8 F (36.6 C) 97.9 F (36.6 C)  TempSrc: Oral Oral Oral Oral  SpO2: 99% 100% 100% 98%  Weight:      Height:        Current Vital Signs 24h Vital Sign Ranges  T 97.9 F (36.6 C) Temp  Avg: 98.3 F (36.8 C)  Min: 97.8 F (36.6 C)  Max: 98.9 F (37.2 C)  BP (!) 110/57 BP  Min: 98/51  Max: 120/64  HR 61 Pulse  Avg: 65.7  Min: 61  Max: 72  RR 17 Resp  Avg: 16.8  Min: 16  Max: 18  SaO2 98 %   SpO2  Avg: 99.2 %  Min: 98 %  Max: 100 %       24 Hour I/O Current Shift I/O  Time Ins Outs 03/21 0701 - 03/22 0700 In: 140.2 [I.V.:63.6] Out: 800 [Urine:700] No intake/output data recorded.  General: NAD Pulmonary: no increased work of breathing Abdomen: non-distended, non-tender, fundus firm at level of umbilicus Extremities: trace edema, no erythema, no tenderness, negative Homan's   Labs:  Recent Labs  Lab 02/15/23 0546 02/17/23 0637  WBC 6.1 6.9  HGB 11.1* 10.0*  HCT 34.1* 31.1*  PLT 222 182     Assessment:   32 y.o. [id number] postpartum day # 1 status post  SVB  Plan:   1) Acute blood loss anemia - hemodynamically stable and asymptomatic - po ferrous sulfate  2) B POS / Rubella <0.90 (09/15 0840)/ Varicella Immune  3) TDAP status 12/01/2022  4) breast /Contraception = no method  5) Disposition anticipate discharge later today based on infant's needs.   [place], CNM  02/17/2023 9:06 AM

## 2023-03-03 ENCOUNTER — Telehealth: Payer: Self-pay | Admitting: Obstetrics and Gynecology

## 2023-03-08 NOTE — Progress Notes (Signed)
   Virtual Visit via Video Note  I connected with Peggy Shaw on 03/10/23 at  10:35 AM EDT by a video enabled telemedicine application and verified that I am speaking with the correct person using two identifiers.  Location: Patient: Home Provider: Office   I discussed the limitations of evaluation and management by telemedicine and the availability of in person appointments. The patient expressed understanding and agreed to proceed.    History of Present Illness:   Peggy Shaw is a 32 y.o. [phone] female who presents for a 3 week televisit for mood check. She is 3 weeks postpartum following a spontaneous vaginal delivery.  The delivery was at 39.6 gestational weeks.  Postpartum course has been well so far. Baby is feeding by bottle and is on Similac Advance. Bleeding: is described as light and has been intermittent. Postpartum depression screening: negative.  EDPS score is 2.      The following portions of the patient's history were reviewed and updated as appropriate: allergies, current medications, past family history, past medical history, past social history, past surgical history, and problem list.   Observations/Objective:   Last menstrual period 05/12/2022, unknown if currently breastfeeding. Gen App: NAD Psych: normal speech, affect. Good mood.        02/16/2023    5:55 PM 08/08/2022    3:31 PM  Edinburgh Postnatal Depression Scale Screening Tool  I have been able to laugh and see the funny side of things. 0 0  I have looked forward with enjoyment to things. 0 0  I have blamed myself unnecessarily when things went wrong. 0 0  I have been anxious or worried for no good reason. 2 0  I have felt scared or panicky for no good reason. 0 0  Things have been getting on top of me. 0 0  I have been so unhappy that I have had difficulty sleeping. 0 0  I have felt sad or miserable. 0 0  I have been so unhappy that I have been crying. 0 0  The thought of harming myself has  occurred to me. 0 0  Edinburgh Postnatal Depression Scale Total 2 0       Assessment and Plan:   1. Encounter for screening for maternal depression - Screening Negative today. Will rescreen at 6 week postpartum visit. Overall doing well.    2. Postpartum state - Overall doing well. Continue routine postpartum home care.      Follow Up Instructions:     I discussed the assessment and treatment plan with the patient. The patient was provided an opportunity to ask questions and all were answered. The patient agreed with the plan and demonstrated an understanding of the instructions.   The patient was advised to call back or seek an in-person evaluation if the symptoms worsen or if the condition fails to improve as anticipated.   I provided 5 minutes of non-face-to-face time during this encounter.     Hildred Laser, MD [facility]

## 2023-03-08 NOTE — Patient Instructions (Signed)
Postpartum Care After Vaginal Delivery The following information offers guidance about how to care for yourself from the time you deliver your baby to 6-12 weeks after delivery (postpartum period). If you have problems or questions, contact your health care provider for more specific instructions. Follow these instructions at home: Vaginal bleeding It is normal to have vaginal bleeding (lochia) after delivery. Wear a sanitary pad for bleeding and discharge. During the first week after delivery, the amount and appearance of lochia is often similar to a menstrual period. Over the next few weeks, it will gradually decrease to a dry, yellow-brown discharge. For most women, lochia stops completely by 4-6 weeks after delivery, but it can vary. Change your sanitary pads frequently. Watch for any changes in your flow, such as: An increase in bleeding. A change in color. Large blood clots. If you pass a blood clot the size of an egg or larger, contact your healthcare provider.. Do not use tampons or douches until your health care provider approves. If you are not breastfeeding, your period should return 6-8 weeks after delivery. If you are feeding your baby breast milk only, your period may not return until you stop breastfeeding. Perineal care  Keep the area between the vagina and the anus (perineum) clean and dry. Use medicated pads and pain-relieving sprays or creams as told. If you had a tear or a surgical cut in the perineum (episiotomy), check the area for signs of infection until you are healed. Check for: More redness, swelling, or pain. Pus or a bad smell. You may be given a squirt bottle to use instead of wiping to clean the perineum area after you use the bathroom. Pat the area gently to dry it. To relieve pain caused by an episiotomy, a tear, or swollen veins in the anus (hemorrhoids), take a warm sitz bath 2-4 times a day, or as many as told by your health care provider. You can use a  bathtub or a basin you put over the toilet as a sitz bath. Breast care In the first few days after delivery, your breasts may feel heavy, full, and uncomfortable (breast engorgement). Milk may also leak from your breasts. Ask your health care provider about ways to help relieve the discomfort. If you breastfeed: Wear a bra that supports your breasts and fits well. Use breast pads to absorb milk that leaks. Keep your nipples clean and dry. Apply creams and ointments as told. You may have uterine contractions every time you breastfeed for up to several weeks after delivery. This helps your uterus return to its normal size. If you have any problems with breastfeeding, tell your health care provider or lactation specialist. If you do not breastfeed: Avoid touching your breasts. Do not squeeze out (express) milk. Doing this can make your breasts produce more milk. Wear a bra that supports your breasts and fits well. Use cold packs to help with swelling. Talk to your health care provider about what over-the-counter medicines can help with pain. Intimacy and sexuality Ask your health care provider when you can engage in sexual activity. This may depend upon: Your risk of infection. How fast you are healing. Your comfort and desire to engage in sexual activity. You are able to get pregnant after delivery, even if you have not had your period. Talk with your health care provider about birth control (contraception) or family planning. Medicines Take over-the-counter and prescription medicines only as told by your health care provider. Take an over-the-counter stool softener to help  ease bowel movements as told by your health care provider. If you were prescribed antibiotics, take them as told by your health care provider. Do not stop using the antibiotic even if you start to feel better. Review all previous and current prescriptions to check for the possible transfer into your breast milk. Ask your  health care provider or lactation specialist for help if needed. Activity Gradually return to your normal activities as told by your health care provider. Rest as much as possible. Try to nap while your baby is sleeping. Eating and drinking  Drink enough fluid to keep your urine pale yellow. To help prevent or relieve constipation, eat high-fiber foods every day. Choose healthy eating to support breastfeeding and healing. Take your prenatal vitamins until your health care provider tells you to stop. General recommendations Do not use any products that contain nicotine or tobacco. These products include cigarettes, chewing tobacco, and vaping devices, such as e-cigarettes. These can delay healing. If you need help quitting, ask your health care provider. Secondhand smoke exposure is dangerous for your baby. It can increase the risk of illness and sudden infant death syndrome (SIDS). Nicotine and other chemicals pass through breast milk to the baby. Alcohol use can be dangerous during the postpartum period. Drinking alcohol may impair your judgment and ability to safely care for your baby. Not drinking alcohol is the safest option for breastfeeding mothers. Alcohol passes through breast milk to the baby. Alcohol can be damaging to your baby's development, growth, and sleep patterns. If you breastfeed and drink alcohol, wait at least 2 hours after a single drink before feeding your baby. Do not take medicines or drugs that are not prescribed to you, especially if you breastfeed. Visit your health care provider for a postpartum checkup within the first 3-6 weeks after delivery. Complete a comprehensive postpartum visit no later than 12 weeks after delivery. Keep all follow-up visits. Your health care provider will check your healing after delivery and also check your blood pressure. Where to find more information U.S. Department of Health and Cytogeneticist of Women's Health:  TravelLesson.ca [employer] of Obstetricians and Gynecologists: acog.org Contact a health care provider if: You feel unusually sad or worried. Your breasts become red, painful, or hard. You have nausea and vomiting and are unable to eat or drink anything for 24 hours. You have a fever or other signs of infection. You have bleeding that soaks through one pad an hour or you have blood clots the size of an egg or larger. You have a severe headache that does not go away or you have a headache with vision changes. Get help right away if: You have chest pain or difficulty breathing. You have sudden, severe leg pain. You faint or have a seizure. You have thoughts about hurting yourself or your baby. You have any of the following symptoms and you were unable to reach your health care provider: A fever or other signs of infection. Bleeding that is soaking through one pad an hour or you have blood clots the size of an egg or larger. A severe headache that does not go away or you have a headache with vision changes. These symptoms may be an emergency. Get help right away. Call 911. Do not wait to see if the symptoms will go away. Do not drive yourself to the hospital. Get help if you ever feel like you may hurt yourself or others, or have thoughts about taking your own life. Go  to your nearest emergency room or: Call 911. Call the National Suicide Prevention Lifeline at (901) 018-32501-(782) 388-9977 or 988. This is open 24 hours a day. Text the Crisis Text Line at 680-018-0974741741. This information is not intended to replace advice given to you by your health care provider. Make sure you discuss any questions you have with your health care provider. Document Revised: 08/25/2022 Document Reviewed: 02/15/2022 Elsevier Patient Education  2023 [employer]

## 2023-03-10 ENCOUNTER — Encounter: Payer: Self-pay | Admitting: Obstetrics and Gynecology

## 2023-03-10 ENCOUNTER — Telehealth (INDEPENDENT_AMBULATORY_CARE_PROVIDER_SITE_OTHER): Payer: Medicaid Other | Admitting: Obstetrics and Gynecology

## 2023-03-10 DIAGNOSIS — Z1332 Encounter for screening for maternal depression: Secondary | ICD-10-CM | POA: Diagnosis not present

## 2023-03-20 ENCOUNTER — Telehealth: Payer: Self-pay

## 2023-03-20 NOTE — Telephone Encounter (Signed)
[facility]- Discharge Call Backs- Spoke to patient on the phone about the following below. 1-Do you have any questions or concerns about yourself as you heal?No 2-Any concerns or questions about your baby?No 3-Reviewed ABC's of safe sleep. 4-How was your stay at the hospital?Good 5- Did our team work together to care for you?Yes You should be receiving a survey in the mail soon.   We would really appreciate it if you could fill that out for us and return it in the mail.  We value the feedback to make improvements and continue the great work we do.   If you have any questions please feel free to call me back at [phone]  

## 2023-03-31 ENCOUNTER — Inpatient Hospital Stay: Payer: Medicaid Other | Attending: Oncology

## 2023-03-31 DIAGNOSIS — D509 Iron deficiency anemia, unspecified: Secondary | ICD-10-CM | POA: Diagnosis present

## 2023-03-31 DIAGNOSIS — D508 Other iron deficiency anemias: Secondary | ICD-10-CM

## 2023-03-31 DIAGNOSIS — Z3A37 37 weeks gestation of pregnancy: Secondary | ICD-10-CM | POA: Diagnosis present

## 2023-03-31 DIAGNOSIS — Z87891 Personal history of nicotine dependence: Secondary | ICD-10-CM | POA: Insufficient documentation

## 2023-03-31 LAB — IRON AND TIBC
Iron: 87 ug/dL (ref 28–170)
Saturation Ratios: 27 % (ref 10.4–31.8)
TIBC: 318 ug/dL (ref 250–450)
UIBC: 231 ug/dL

## 2023-03-31 LAB — CBC WITH DIFFERENTIAL/PLATELET
Abs Immature Granulocytes: 0.01 10*3/uL (ref 0.00–0.07)
Basophils Absolute: 0 10*3/uL (ref 0.0–0.1)
Basophils Relative: 1 %
Eosinophils Absolute: 0.2 10*3/uL (ref 0.0–0.5)
Eosinophils Relative: 3 %
HCT: 36.4 % (ref 36.0–46.0)
Hemoglobin: 11.6 g/dL — ABNORMAL LOW (ref 12.0–15.0)
Immature Granulocytes: 0 %
Lymphocytes Relative: 29 %
Lymphs Abs: 1.7 10*3/uL (ref 0.7–4.0)
MCH: 27.8 pg (ref 26.0–34.0)
MCHC: 31.9 g/dL (ref 30.0–36.0)
MCV: 87.3 fL (ref 80.0–100.0)
Monocytes Absolute: 0.4 10*3/uL (ref 0.1–1.0)
Monocytes Relative: 7 %
Neutro Abs: 3.5 10*3/uL (ref 1.7–7.7)
Neutrophils Relative %: 60 %
Platelets: 247 10*3/uL (ref 150–400)
RBC: 4.17 MIL/uL (ref 3.87–5.11)
RDW: 15.4 % (ref 11.5–15.5)
WBC: 5.9 10*3/uL (ref 4.0–10.5)
nRBC: 0 % (ref 0.0–0.2)

## 2023-03-31 LAB — FERRITIN: Ferritin: 91 ng/mL (ref 11–307)

## 2023-04-04 MED FILL — Iron Sucrose Inj 20 MG/ML (Fe Equiv): INTRAVENOUS | Qty: 10 | Status: AC

## 2023-04-05 ENCOUNTER — Inpatient Hospital Stay: Payer: Medicaid Other

## 2023-04-05 ENCOUNTER — Inpatient Hospital Stay: Payer: Medicaid Other | Admitting: Oncology

## 2023-04-05 NOTE — Patient Instructions (Signed)

## 2023-04-05 NOTE — Progress Notes (Signed)
   OBSTETRICS POSTPARTUM CLINIC PROGRESS NOTE  Subjective:     Peggy Shaw is a 32 y.o. [phone] female who presents for a postpartum visit. She is 6 weeks postpartum following a spontaneous vaginal delivery. I have fully reviewed the prenatal and intrapartum course. The delivery was at 39.6 gestational weeks.  Anesthesia: epidural. Postpartum course has been going well. Baby's course has been good. Baby is feeding by bottle - Similac Advance. Bleeding: patient has not resumed menses, with Patient's last menstrual period was 04/04/2023 (exact date). Bowel function is normal. Bladder function is normal. Patient is not sexually active. Contraception method desired is condoms. Postpartum depression screening: negative.  EDPS score is 1.    The following portions of the patient's history were reviewed and updated as appropriate: allergies, current medications, past family history, past medical history, past social history, past surgical history, and problem list.  Review of Systems Pertinent items are noted in HPI.   Objective:    BP (!) 99/52   Pulse 65   Resp 16   Ht 5\' 4"  (1.626 m)   Wt 244 lb (110.7 kg)   LMP 04/04/2023 (Exact Date)   BMI 41.88 kg/m   General:  alert and no distress   Breasts:  inspection negative, no nipple discharge or bleeding, no masses or nodularity palpable  Lungs: clear to auscultation bilaterally  Heart:  regular rate and rhythm, S1, S2 normal, no murmur, click, rub or gallop  Abdomen: soft, non-tender; bowel sounds normal; no masses,  no organomegaly.  Well healed Pfannenstiel incision   Vulva:  normal  Vagina: normal vagina, no discharge, exudate, lesion, or erythema  Cervix:  no cervical motion tenderness and no lesions  Corpus: normal size, contour, position, consistency, mobility, non-tender  Adnexa:  normal adnexa and no mass, fullness, tenderness  Rectal Exam: Not performed.         Labs:  Lab Results  Component Value Date   HGB 11.6 (L)  03/31/2023     Assessment:   1. Postpartum care following vaginal delivery   2. Encounter for screening for maternal depression      Plan:    1. Contraception: none 2. Will check Hgb for h/o postpartum anemia of less than 10.  3. Follow up in:  9  months for annual exam.    Hildred Laser, MD [facility]

## 2023-04-11 ENCOUNTER — Encounter: Payer: Self-pay | Admitting: Obstetrics and Gynecology

## 2023-04-11 ENCOUNTER — Ambulatory Visit (INDEPENDENT_AMBULATORY_CARE_PROVIDER_SITE_OTHER): Payer: Medicaid Other | Admitting: Obstetrics and Gynecology

## 2023-04-11 DIAGNOSIS — Z1332 Encounter for screening for maternal depression: Secondary | ICD-10-CM | POA: Diagnosis not present

## 2023-04-14 MED FILL — Iron Sucrose Inj 20 MG/ML (Fe Equiv): INTRAVENOUS | Qty: 10 | Status: AC

## 2023-04-17 ENCOUNTER — Encounter: Payer: Self-pay | Admitting: Oncology

## 2023-04-17 ENCOUNTER — Inpatient Hospital Stay: Payer: Medicaid Other

## 2023-04-17 ENCOUNTER — Inpatient Hospital Stay (HOSPITAL_BASED_OUTPATIENT_CLINIC_OR_DEPARTMENT_OTHER): Payer: Medicaid Other | Admitting: Oncology

## 2023-04-17 VITALS — BP 107/51 | HR 70 | Temp 96.7°F | Resp 18 | Wt 245.8 lb

## 2023-04-17 DIAGNOSIS — D508 Other iron deficiency anemias: Secondary | ICD-10-CM

## 2023-04-17 DIAGNOSIS — D509 Iron deficiency anemia, unspecified: Secondary | ICD-10-CM | POA: Diagnosis not present

## 2023-04-17 NOTE — Assessment & Plan Note (Addendum)
S/p IV Venofer  Labs are reviewed and discussed with patient. Lab Results  Component Value Date   HGB 11.6 (L) 03/31/2023   TIBC 318 03/31/2023   IRONPCTSAT 27 03/31/2023   FERRITIN 91 03/31/2023   Mild anemia, Hb is at baseline.  No need for additional Venofer.

## 2023-04-17 NOTE — Progress Notes (Signed)
Hematology/Oncology Consult note [phone] [phone]     CHIEF COMPLAINTS/REASON FOR VISIT:  Iron deficiency Anemia  ASSESSMENT & PLAN:   IDA (iron deficiency anemia) S/p IV Venofer  Labs are reviewed and discussed with patient. Lab Results  Component Value Date   HGB 11.6 (L) 03/31/2023   TIBC 318 03/31/2023   IRONPCTSAT 27 03/31/2023   FERRITIN 91 03/31/2023   Mild anemia, Hb is at baseline.  No need for additional Venofer.  Patient is discharged from my clinic. I recommend patient to continue follow up with primary care physician. Patient may re-establish care in the future if clinically indicated.  All questions were answered. The patient knows to call the clinic with any problems, questions or concerns.  Rickard Patience, MD, PhD [facility] Hematology Oncology 04/17/2023     HISTORY OF PRESENTING ILLNESS:  Peggy Shaw is a  32 y.o.  female with PMH listed below who was referred to me for anemia Reviewed patient's recent labs that was done.  CBC on 12/15/2022 with hemoglobin 9.6, MCV 86. Reviewed patient's previous labs ordered by primary care physician's office, anemia is chronic onset , duration is since 2017. Currently patient is pregnant, in third trimester.  Denies any pregnancy complications. Patient reports feeling tired.  She was recommended to take oral iron supplementation which made her stomach sick and she she is not able to tolerate. She denies recent chest pain on exertion, shortness of breath on minimal exertion, pre-syncopal episodes, or palpitations She had not noticed any recent bleeding such as epistaxis, hematuria or hematochezia.   INTERVAL HISTORY Peggy Shaw is a 32 y.o. female who has above history reviewed by me today presents for follow up visit for iron deficiency anemia. Status post IV Venofer treatments.  She tolerated well.  Fatigue has improved. She developed her daughter on 02/16/2023.   MEDICAL HISTORY:   Past Medical History:  Diagnosis Date   Abnormal uterine bleeding    Migraines     SURGICAL HISTORY: Past Surgical History:  Procedure Laterality Date   HAND SURGERY Right 2015   PERIPHERAL VASCULAR THROMBECTOMY Right 2013   removed from right hand   WISDOM TOOTH EXTRACTION  2020   [age]    SOCIAL HISTORY: Social History   Socioeconomic History   Marital status: Single    Spouse name: Not on file   Number of children: 2   Years of education: 12   Highest education level: Not on file  Occupational History   Occupation: healthcare  Tobacco Use   Smoking status: Former    Packs/day: .25    Types: Cigarettes    Quit date: 03/04/2018    Years since quitting: 5.1   Smokeless tobacco: Never  Vaping Use   Vaping Use: Never used  Substance and Sexual Activity   Alcohol use: Not Currently    Alcohol/week: 3.0 standard drinks of alcohol    Types: 3 Shots of liquor per week    Comment: last use 02/10/22 3x/mo   Drug use: No   Sexual activity: Yes    Partners: Male    Birth control/protection: None  Other Topics Concern   Not on file  Social History Narrative   Not on file   Social Determinants of Health   Financial Resource Strain: High Risk (08/08/2022)   Overall Financial Resource Strain (CARDIA)    Difficulty of Paying Living Expenses: Hard  Food Insecurity: No Food Insecurity (02/15/2023)   Hunger Vital Sign    Worried  About Running Out of Food in the Last Year: Never true    Ran Out of Food in the Last Year: Never true  Transportation Needs: No Transportation Needs (02/15/2023)   PRAPARE - Administrator, Civil Service (Medical): No    Lack of Transportation (Non-Medical): No  Physical Activity: Insufficiently Active (08/08/2022)   Exercise Vital Sign    Days of Exercise per Week: 2 days    Minutes of Exercise per Session: 30 min  Stress: No Stress Concern Present (08/08/2022)   [employer] of Occupational Health - Occupational Stress  Questionnaire    Feeling of Stress : Not at all  Social Connections: Unknown (08/08/2022)   Social Connection and Isolation Panel [NHANES]    Frequency of Communication with Friends and Family: Twice a week    Frequency of Social Gatherings with Friends and Family: Once a week    Attends Religious Services: Never    Database administrator or Organizations: No    Attends Banker Meetings: Never    Marital Status: Not on file  Intimate Partner Violence: Not At Risk (02/15/2023)   Humiliation, Afraid, Rape, and Kick questionnaire    Fear of Current or Ex-Partner: No    Emotionally Abused: No    Physically Abused: No    Sexually Abused: No    FAMILY HISTORY: Family History  Problem Relation Age of Onset   Seizures Sister    Lung cancer Maternal Aunt    Diabetes Neg Hx    Hypertension Neg Hx    Stroke Neg Hx    Cancer Neg Hx    Thyroid disease Neg Hx     ALLERGIES:  has No Known Allergies.  MEDICATIONS:  Current Outpatient Medications  Medication Sig Dispense Refill   acetaminophen (TYLENOL) 325 MG tablet Take 2 tablets (650 mg total) by mouth every 6 (six) hours as needed (for pain scale < 4).     ibuprofen (ADVIL) 600 MG tablet Take 1 tablet (600 mg total) by mouth every 6 (six) hours as needed. 30 tablet 0   No current facility-administered medications for this visit.    Review of Systems  Constitutional:  Positive for fatigue. Negative for appetite change, chills and fever.  HENT:   Negative for hearing loss and voice change.   Eyes:  Negative for eye problems.  Respiratory:  Negative for chest tightness and cough.   Cardiovascular:  Negative for chest pain.  Gastrointestinal:  Negative for abdominal distention, abdominal pain and blood in stool.  Endocrine: Negative for hot flashes.  Genitourinary:  Negative for difficulty urinating and frequency.   Musculoskeletal:  Negative for arthralgias.  Skin:  Negative for itching and rash.  Neurological:   Negative for extremity weakness.  Hematological:  Negative for adenopathy.  Psychiatric/Behavioral:  Negative for confusion.     PHYSICAL EXAMINATION:  Vitals:   04/17/23 1309  BP: (!) 107/51  Pulse: 70  Resp: 18  Temp: (!) 96.7 F (35.9 C)  SpO2: 99%   Filed Weights   04/17/23 1309  Weight: 245 lb 12.8 oz (111.5 kg)    Physical Exam Constitutional:      General: She is not in acute distress. HENT:     Head: Normocephalic and atraumatic.  Eyes:     General: No scleral icterus. Cardiovascular:     Rate and Rhythm: Normal rate and regular rhythm.  Pulmonary:     Effort: Pulmonary effort is normal. No respiratory distress.  Breath sounds: No wheezing.  Musculoskeletal:        General: No deformity. Normal range of motion.     Cervical back: Normal range of motion and neck supple.  Skin:    General: Skin is warm and dry.     Findings: No erythema or rash.  Neurological:     Mental Status: She is alert and oriented to person, place, and time. Mental status is at baseline.     Cranial Nerves: No cranial nerve deficit.     Coordination: Coordination normal.  Psychiatric:        Mood and Affect: Mood normal.      LABORATORY DATA:  I have reviewed the data as listed    Latest Ref Rng & Units 03/31/2023   11:20 AM 02/17/2023    6:37 AM 02/15/2023    5:46 AM  CBC  WBC 4.0 - 10.5 K/uL 5.9  6.9  6.1   Hemoglobin 12.0 - 15.0 g/dL 16.1  09.6  04.5   Hematocrit 36.0 - 46.0 % 36.4  31.1  34.1   Platelets 150 - 400 K/uL 247  182  222       Latest Ref Rng & Units 08/15/2022    9:13 AM 05/02/2021    2:36 AM 03/13/2018    4:27 PM  CMP  Glucose 70 - 99 mg/dL 82  409  90   BUN 6 - 20 mg/dL 7  10  14    Creatinine 0.57 - 1.00 mg/dL 8.11  9.14  7.82   Sodium 134 - 144 mmol/L 139  133  140   Potassium 3.5 - 5.2 mmol/L 3.5  3.9  4.6   Chloride 96 - 106 mmol/L 103  102  104   CO2 20 - 29 mmol/L 23  23  21    Calcium 8.7 - 10.2 mg/dL 9.1  8.7  9.6   Total Protein 6.0 - 8.5  g/dL 6.0  7.4    Total Bilirubin 0.0 - 1.2 mg/dL <9.5  0.6    Alkaline Phos 44 - 121 IU/L 70  69    AST 0 - 40 IU/L 10  18    ALT 0 - 32 IU/L 9  17        Component Value Date/Time   IRON 87 03/31/2023 1120   TIBC 318 03/31/2023 1120   FERRITIN 91 03/31/2023 1120   IRONPCTSAT 27 03/31/2023 1120     RADIOGRAPHIC STUDIES: I have personally reviewed the radiological images as listed and agreed with the findings in the report.

## 2023-08-09 ENCOUNTER — Ambulatory Visit (INDEPENDENT_AMBULATORY_CARE_PROVIDER_SITE_OTHER): Payer: Medicaid Other

## 2023-08-09 ENCOUNTER — Other Ambulatory Visit (HOSPITAL_COMMUNITY)
Admission: RE | Admit: 2023-08-09 | Discharge: 2023-08-09 | Disposition: A | Discharge status: Home / Self Care | Payer: Medicaid Other | Source: Ambulatory Visit | Attending: Obstetrics and Gynecology | Admitting: Obstetrics and Gynecology

## 2023-08-09 VITALS — BP 101/67 | HR 68 | Ht 64.0 in | Wt 238.0 lb

## 2023-08-09 DIAGNOSIS — Z113 Encounter for screening for infections with a predominantly sexual mode of transmission: Secondary | ICD-10-CM | POA: Diagnosis present

## 2023-08-09 DIAGNOSIS — N898 Other specified noninflammatory disorders of vagina: Secondary | ICD-10-CM | POA: Diagnosis present

## 2023-08-09 NOTE — Patient Instructions (Signed)
Bacterial Vaginosis  Bacterial vaginosis is an infection of the vagina. It happens when too many normal germs (healthy bacteria) grow in the vagina. This infection can make it easier to get other infections from sex (STIs). It is very important for pregnant women to get treated. This infection can cause babies to be born early or at a low birth weight. What are the causes? This infection is caused by an increase in certain germs that grow in the vagina. You cannot get this infection from toilet seats, bedsheets, swimming pools, or things that touch your vagina. What increases the risk? Having sex with a new person or more than one person. Having sex without protection. Douching. Having an intrauterine device (IUD). Smoking. Using drugs or drinking alcohol. These can lead you to do things that are risky. Taking certain antibiotic medicines. Being pregnant. What are the signs or symptoms? Some women have no symptoms. Symptoms may include: A discharge from your vagina. It may be gray or white. It can be watery or foamy. A fishy smell. This can happen after sex or during your menstrual period. Itching in and around your vagina. A feeling of burning or pain when you pee (urinate). How is this treated? This infection is treated with antibiotic medicines. These may be given to you as: A pill. A cream for your vagina. A medicine that you put into your vagina (suppository). If the infection comes back after treatment, you may need more antibiotics. Follow these instructions at home: Medicines Take over-the-counter and prescription medicines as told by your doctor. Take or use your antibiotic medicine as told by your doctor. Do not stop taking or using it, even if you start to feel better. General instructions If the person you have sex with is a woman, tell her that you have this infection. She will need to follow up with her doctor. If you have a female partner, he does not need to be  treated. Do not have sex until you finish treatment. Drink enough fluid to keep your pee pale yellow. Keep your vagina and butt clean. Wash the area with warm water each day. Wipe from front to back after you use the toilet. If you are breastfeeding a baby, ask your doctor if you should keep doing so during treatment. Keep all follow-up visits. How is this prevented? Self-care Do not douche. Use only warm water to wash around your vagina. Wear underwear that is cotton or lined with cotton. Do not wear tight pants and pantyhose, especially in the summer. Safe sex Use protection when you have sex. This includes: Use condoms. Use dental dams. This is a thin layer that protects the mouth during oral sex. Limit how many people you have sex with. To prevent this infection, it is best to have sex with just one person. Get tested for STIs. The person you have sex with should also get tested. Drugs and alcohol Do not smoke or use any products that contain nicotine or tobacco. If you need help quitting, ask your doctor. Do not use drugs. Do not drink alcohol if: Your doctor tells you not to drink. You are pregnant, may be pregnant, or are planning to become pregnant. If you drink alcohol: Limit how much you have to 0-1 drink a day. Know how much alcohol is in your drink. In the [place] one drink equals one 12 oz bottle of beer (355 mL), one 5 oz glass of wine (148 mL), or one 1 oz glass of hard liquor (44  mL). Where to find more information Centers for Disease Control and Prevention: FootballExhibition.com.br American Sexual Health Association: www.ashastd.org Office on [employer] Health: http://hoffman.com/ Contact a doctor if: Your symptoms do not get better, even after you are treated. You have more discharge or pain when you pee. You have a fever or chills. You have pain in your belly (abdomen) or in the area between your hips. You have pain with sex. You bleed from your vagina between menstrual  periods. Summary This infection can happen when too many germs (bacteria) grow in the vagina. This infection can make it easier to get infections from sex (STIs). Treating this can lower that chance. Get treated if you are pregnant. This infection can cause babies to be born early. Do not stop taking or using your antibiotic medicine, even if you start to feel better. This information is not intended to replace advice given to you by your health care provider. Make sure you discuss any questions you have with your health care provider. Document Revised: 05/14/2020 Document Reviewed: 05/14/2020 Elsevier Patient Education  2024 Else[employer] Inc. Vaginal Yeast Infection, Adult  Vaginal yeast infection is a condition that causes vaginal discharge as well as soreness, swelling, and redness (inflammation) of the vagina. This is a common condition. Some women get this infection frequently. What are the causes? This condition is caused by a change in the normal balance of the yeast (Candida) and normal bacteria that live in the vagina. This change causes an overgrowth of yeast, which causes the inflammation. What increases the risk? The condition is more likely to develop in women who: Take antibiotic medicines. Have diabetes. Take birth control pills. Are pregnant. Douche often. Have a weak body defense system (immune system). Have been taking steroid medicines for a long time. Frequently wear tight clothing. What are the signs or symptoms? Symptoms of this condition include: White, thick, creamy vaginal discharge. Swelling, itching, redness, and irritation of the vagina. The lips of the vagina (labia) may be affected as well. Pain or a burning feeling while urinating. Pain during sex. How is this diagnosed? This condition is diagnosed based on: Your medical history. A physical exam. A pelvic exam. Your health care provider will examine a sample of your vaginal discharge under a microscope.  Your health care provider may send this sample for testing to confirm the diagnosis. How is this treated? This condition is treated with medicine. Medicines may be over-the-counter or prescription. You may be told to use one or more of the following: Medicine that is taken by mouth (orally). Medicine that is applied as a cream (topically). Medicine that is inserted directly into the vagina (suppository). Follow these instructions at home: Take or apply over-the-counter and prescription medicines only as told by your health care provider. Do not use tampons until your health care provider approves. Do not have sex until your infection has cleared. Sex can prolong or worsen your symptoms of infection. Ask your health care provider when it is safe to resume sexual activity. Keep all follow-up visits. This is important. How is this prevented?  Do not wear tight clothes, such as pantyhose or tight pants. Wear breathable cotton underwear. Do not use douches, perfumed soap, creams, or powders. Wipe from front to back after using the toilet. If you have diabetes, keep your blood sugar levels under control. Ask your health care provider for other ways to prevent yeast infections. Contact a health care provider if: You have a fever. Your symptoms go away and then  return. Your symptoms do not get better with treatment. Your symptoms get worse. You have new symptoms. You develop blisters in or around your vagina. You have blood coming from your vagina and it is not your menstrual period. You develop pain in your abdomen. Summary Vaginal yeast infection is a condition that causes discharge as well as soreness, swelling, and redness (inflammation) of the vagina. This condition is treated with medicine. Medicines may be over-the-counter or prescription. Take or apply over-the-counter and prescription medicines only as told by your health care provider. Do not douche. Resume sexual activity or use of  tampons as instructed by your health care provider. Contact a health care provider if your symptoms do not get better with treatment or your symptoms go away and then return. This information is not intended to replace advice given to you by your health care provider. Make sure you discuss any questions you have with your health care provider. Document Revised: 02/01/2021 Document Reviewed: 02/01/2021 Elsevier Patient Education  2024 Else[employer] Inc. Trichomoniasis Trichomoniasis is a sexually transmitted infection (STI). Many people with trichomoniasis do not have any symptoms (are asymptomatic) or have only minimal symptoms. Untreated trichomoniasis can last from months to years. This condition is treated with medicine. What are the causes? This condition is caused by a parasite called Trichomonas vaginalis and is transmitted during sexual contact. What increases the risk? The following factors may make you more likely to develop this condition: Having unprotected sex. Having sex with a partner who has trichomoniasis. Having multiple sexual partners. Having had previous trichomoniasis infections or other STIs. What are the signs or symptoms? In females, symptoms of trichomoniasis include: Itching, burning, redness, or soreness in the genital area. Discomfort while urinating. Abnormal vaginal discharge that is clear, white, gray, or yellow-green and foamy and has an unusual fishy odor. In males, symptoms of trichomoniasis include: Discharge from the penis. Burning after urination or ejaculation. Itching or discomfort inside the penis. How is this diagnosed? This condition is diagnosed based on tests. To perform a test, your health care provider will do one of the following: Ask you to provide a urine sample. Take a sample of discharge. The sample may be taken from the vagina or cervix in females and from the urethra in males. Your health care provider may use a swab to collect the  sample. Your health care provider may test you for other STIs, including human immunodeficiency virus (HIV). How is this treated?  This condition is treated with medicines such as metronidazole or tinidazole. These are called antimicrobial medicines, and they are taken by mouth (orally). Your sexual partner or partners also need to be tested and treated. If they have the infection and are not treated, you will likely get reinfected. If you plan to become pregnant or think you may be pregnant, tell your health care provider right away. Some medicines that are used to treat the infection should not be taken during pregnancy. Your health care provider may recommend over-the-counter medicines or creams to help relieve itching or irritation. You may be tested for the infection again 3 months after treatment. Follow these instructions at home: Medicines Take over-the-counter and prescription medicines only as told by your health care provider. Take your antimicrobial medicine as told by your health care provider. Do not stop taking it even if you start to feel better. Use creams as told by your health care provider. General instructions Do not have sex until after you finish your medicine and your symptoms  have resolved. Do not wear tampons while you have the infection (if you are female). Talk with your sexual partner or partners about any symptoms that either of you may have, as well as any history of STIs. Keep all follow-up visits. This is important. How is this prevented?  Use condoms every time you have sex. Using condoms correctly and consistently can help protect against STIs. Do not have sexual contact if you have symptoms of trichomoniasis or another STI. Avoid having multiple sexual partners. Get tested for STIs before you have sex with a partner. Ask all partners to do the same. Do not douche (if you are female). Douching may increase your risk for getting STIs due to the removal of good  bacteria in the vagina. Contact a health care provider if: You still have symptoms after you finish your medicine. You develop a rash. You plan to become pregnant or think you may be pregnant. Summary Trichomoniasis is a sexually transmitted infection (STI). This condition often has no symptoms or minimal symptoms. Take your antimicrobial medicine as told by your health care provider. Do not stop taking even if you start to feel better. Discuss your infection with your sexual partner or partners. Make sure that all partners get tested and treated, if necessary. You should not have sex until after you finish your medicine and your symptoms have resolved. Keep all follow-up visits. This is important. This information is not intended to replace advice given to you by your health care provider. Make sure you discuss any questions you have with your health care provider. Document Revised: 10/13/2021 Document Reviewed: 10/13/2021 Elsevier Patient Education  2024 Elsevier Inc. Chlamydia, Female  Chlamydia is a sexually transmitted infection (STI). This infection spreads through sexual contact. Chlamydia can occur in different areas of the body, including: The urethra. This is the part of the body that drains urine from the bladder. The cervix. This is the lowest part of the uterus. The throat. The rectum. This condition is not difficult to treat. However, if left untreated, chlamydia can lead to more serious health problems, including pelvic inflammatory disease (PID). PID can increase your risk of being unable to have children. In pregnant women, untreated chlamydia can cause serious complications during pregnancy or health problems for the baby after delivery. What are the causes? This condition is caused by a bacteria called Chlamydia trachomatis. The bacteria are spread from an infected partner during sexual activity. Chlamydia can spread through contact with the genitals, mouth, or  rectum. What increases the risk? The following factors may make you more likely to develop this condition: Not using a condom the right way or not using a condom every time you have sex. Having a new sex partner or having more than one sex partner. Being sexually active before age 42. What are the signs or symptoms? In some cases, there are no symptoms, especially early in the infection. If symptoms develop, they may include: Urinating often, or a burning feeling during urination. Redness, soreness, or swelling of the vagina or rectum. Discharge coming from the vagina or rectum. Pain in the abdomen. Pain during sex. Bleeding between menstrual periods or irregular periods. How is this diagnosed? This condition may be diagnosed with: Urine tests. Swab tests. Depending on your symptoms, your health care provider may use a cotton swab to collect a fluid sample from your vagina, rectum, nose, or throat to test for the bacteria. A pelvic exam. How is this treated? This condition is treated with antibiotic  medicines. Follow these instructions at home: Sexual activity Tell your sex partner or partners about your infection. These include any partners for oral, anal, or vaginal sex that you have had within 60 days of when your symptoms started. Sex partners should also be treated, even if they have no signs of the infection. Do not have sex until you and your sex partners have completed treatment and your health care provider says it is okay. If your health care provider prescribed you a single-dose medicine as treatment, wait at least 7 days after taking the medicine before having sex. General instructions Take over-the-counter and prescription medicines as told by your health care provider. Finish all antibiotic medicine even when you start to feel better. It is up to you to get your test results. Ask your health care provider, or the department that is doing the test, when your results will be  ready. Keep all follow-up visits. This is important. You may need to be tested for infection again 3 months after treatment. How is this prevented? You can lower your risk of getting chlamydia by: Using latex or polyurethane condoms correctly every time you have sex. Not having multiple sex partners. Asking if your sex partner has been tested for STIs and had negative results. Getting regular health screenings to check for STIs. Contact a health care provider if: You develop new symptoms or your symptoms are getting worse. Your symptoms do not get better after treatment. You have a fever or chills. You have pain during sex. You have irregular menstrual periods, or you have bleeding between periods or after sex. You develop flu-like symptoms, such as night sweats, sore throat, or muscle aches. You are unable to take your antibiotic medicine as prescribed. Summary Chlamydia is a sexually transmitted infection (STI) that is caused by bacteria. This infection spreads through sexual contact. This condition is treated with antibiotic medicines. If left untreated, chlamydia can lead to more serious health problems, including pelvic inflammatory disease (PID). Your sex partners will also need to be treated. Do not have sex until both you and your partner have been treated. Take medicines as directed by your health care provider and keep all follow-up visits to ensure your infection has been completely treated. This information is not intended to replace advice given to you by your health care provider. Make sure you discuss any questions you have with your health care provider. Document Revised: 09/06/2021 Document Reviewed: 09/06/2021 Elsevier Patient Education  2024 Else[employer] Inc. Gonorrhea Gonorrhea is a sexually transmitted infection (STI) that can infect any person. If left untreated, this infection can: Damage the reproductive organs. Spread to other parts of the body. Cause someone to be  unable to have children (infertility). Harm an unborn baby if an infected person is pregnant. It is important to get treatment for gonorrhea as soon as possible. All of your sex partners may also need to be treated for the infection. What are the causes? This condition is caused by bacteria called Neisseria gonorrhoeae. The infection is spread from person to person through sexual contact, including oral, anal, and vaginal sex. The infection can also pass from a pregnant person to the baby during birth. What increases the risk? The following factors may make you more likely to develop this condition: Being a woman younger than 25 years and sexually active. Being a man who has sex with men. Having a new sex partner or having multiple partners. Having a sex partner who has an STI. Not using condoms  correctly or not using condoms every time you have sex. Having a history of STIs. What are the signs or symptoms? When symptoms occur, they may include: Abnormal discharge from the penis or vagina. The discharge may be cloudy, thick, or yellow-green in color. Pain or burning when you urinate. Itching, irritation, pain, bleeding, or discharge from the rectum. This may occur if the infection was spread by anal sex. Sore throat or swollen lymph nodes in the neck. This may occur if the infection was spread by oral sex. Pain or swelling in the testicles. Bleeding between menstrual periods. If the infection has spread to other areas of the body, symptoms may include: Fever. Eye irritation. Swelling, redness, warmth, and pain in the joints. Rashes. In some cases, there are no symptoms. How is this diagnosed? This condition is diagnosed based on: A physical exam. A swab of fluid. The swab of fluid may be taken from the penis, vagina, throat, or rectum. Urine tests. Not all test results will be available during your visit. How is this treated? This condition is treated with antibiotic medicines. It  is important to start treatment as soon as possible. Early treatment may prevent some problems from developing. You should not have sex during treatment. All types of sexual activity should be avoided for at least 7 days after treatment is complete and until your sex partner or partners have been treated. Follow these instructions at home: Take over-the-counter and prescription medicines as told by your health care provider. Finish all antibiotic medicine even when you start to feel better. Do not have sex during treatment. Do not have sex until at least 7 days after you and your partner or partners have finished treatment and your health care provider says it is okay. It is up to you to get your test results. Ask your health care provider, or the department that is doing the test, when your results will be ready. If you get a positive result on your gonorrhea test, tell your recent sex partners. These include any partners for oral, anal, or vaginal sex. They need to be checked for gonorrhea even if they do not have symptoms. They may need treatment, even if they get negative results on their gonorrhea tests. Keep all follow-up visits. This is important. How is this prevented?  Use latex or polyurethane condoms correctly every time you have sex. Ask if your sex partner or partners have been tested for STIs and had negative results. Avoid having multiple sex partners. Get regular health screenings to check for STIs. Contact a health care provider if: Your symptoms do not get better after a few days of taking antibiotics. Your symptoms get worse. You cannot take your medicine as directed by your health care provider. You develop new symptoms, including: Eye irritation. Swelling, redness, warmth, and pain in the joints. Rashes. You have a fever. Summary Gonorrhea is a sexually transmitted infection (STI) that can infect any person. This infection is spread from person to person through sexual  contact, including oral, anal, and vaginal sex. The infection can also pass from a pregnant person to the baby during birth. Symptoms include abnormal discharge, pain or burning while urinating, or pain in the rectum. This condition is treated with antibiotic medicines. Do not have sex until at least 7 days after both you and any sex partners have completed antibiotic treatment. Tell your health care provider if you have trouble taking your medicine, your symptoms get worse, or you have new symptoms. Keep all  follow-up visits. This information is not intended to replace advice given to you by your health care provider. Make sure you discuss any questions you have with your health care provider. Document Revised: 10/07/2021 Document Reviewed: 10/07/2021 Elsevier Patient Education  2024 [employer].

## 2023-08-09 NOTE — Progress Notes (Signed)
    NURSE VISIT NOTE  Subjective:    Patient ID: Peggy Shaw, female    DOB: 1991/10/07, 32 y.o.   MRN: [id number]  HPI  Patient is a 32 y.o. [phone] female who presents for a self swab nurse visit for vaginal discharge for one week. Denies abnormal vaginal bleeding or significant pelvic pain or fever. Denies UTI symptoms. Patient does not have history of known exposure to STD.   Objective:    BP 101/67   Pulse 68   Ht 5\' 4"  (1.626 m)   Wt 238 lb (108 kg)   LMP 07/13/2023 (Exact Date)   Breastfeeding No   BMI 40.85 kg/m    After patient self swabbed she opened the door and asked for someone to go in room. Upon entering and closing door, she pulled down left side of her pants and asked if I could see if she had an ingrown hair/lump on her left side pelvic area. At first I couldn't tell if there was, so she then pointed/touched where she felt the lump and from what I can see there was a very small lump. She then pulled her pants up and visit was wrapped up.  Assessment:   1. Screening for STD (sexually transmitted disease)   2. Vaginal discharge      Plan:   [facility] sent to lab. Treatment: Will wait for results to be treated if needed. ROV prn if symptoms persist or worsen.   Donnetta Hail, CMA

## 2023-08-11 LAB — CERVICOVAGINAL ANCILLARY ONLY
Bacterial Vaginitis (gardnerella): NEGATIVE
Candida Glabrata: NEGATIVE
Candida Vaginitis: NEGATIVE
Chlamydia: NEGATIVE
Comment: NEGATIVE
Comment: NEGATIVE
Comment: NEGATIVE
Comment: NEGATIVE
Comment: NEGATIVE
Comment: NORMAL
Neisseria Gonorrhea: NEGATIVE
Trichomonas: NEGATIVE

## 2023-08-17 ENCOUNTER — Ambulatory Visit: Payer: Medicaid Other

## 2023-11-01 ENCOUNTER — Ambulatory Visit: Payer: Medicaid Other | Admitting: Advanced Practice Midwife

## 2023-11-01 DIAGNOSIS — Z113 Encounter for screening for infections with a predominantly sexual mode of transmission: Secondary | ICD-10-CM | POA: Diagnosis not present

## 2023-11-01 LAB — WET PREP FOR TRICH, YEAST, CLUE
Trichomonas Exam: NEGATIVE
Yeast Exam: NEGATIVE

## 2023-11-01 LAB — HM HEPATITIS C SCREENING LAB: HM Hepatitis Screen: NEGATIVE

## 2023-11-01 LAB — HM HIV SCREENING LAB: HM HIV Screening: NEGATIVE

## 2023-11-01 NOTE — Progress Notes (Signed)
[facility] Department  STI clinic/screening visit [address] [place] [address] [phone]  Subjective:  Peggy Shaw is a 32 y.o. SBF exsmoker G4P3  female being seen today for an STI screening visit. The patient reports they do not have symptoms.  Patient reports that they do not desire a pregnancy in the next year.   They reported they are not interested in discussing contraception today.    No LMP recorded.  Patient has the following medical conditions:   Patient Active Problem List   Diagnosis Date Noted   Smoker 5-7 cpd 02/11/2022   IDA (iron deficiency anemia) 03/13/2017   BMI 40.0-44.9, adult (HCC) 01/30/2017    Chief Complaint  Patient presents with   SEXUALLY TRANSMITTED DISEASE    Pt is here for STD screening. Pt has no symptoms    HPI  Patient reports here for STD screen. Pt states had a "bump or boil" which resolved 08/2023. LMP 10/22/23. Last pap 01/06/21 neg. Last cig 09/2023. Last ETOH 10/30/23 (5 shots liquor). Last sex 10/30/23 without condom; with current partner x 6 mo.  Does the patient using douching products? No  Last HIV test per patient/review of record was  Lab Results  Component Value Date   HMHIVSCREEN Negative - Validated 06/09/2022    Lab Results  Component Value Date   HIV Non Reactive 08/12/2022     Last HEPC test per patient/review of record was No results found for: "HMHEPCSCREEN" No components found for: "HEPC"   Last HEPB test per patient/review of record was No components found for: "HMHEPBSCREEN" No components found for: "HEPC"   Patient reports last pap was No results found for: "DIAGPAP"  Lab Results  Component Value Date   SPECADGYN Comment 01/06/2021    Screening for MPX risk: Does the patient have an unexplained rash? No Is the patient MSM? No Does the patient endorse multiple sex partners or anonymous sex partners? No Did the patient have close or sexual contact with a person diagnosed  with MPX? No Has the patient traveled outside the [place] where MPX is endemic? No Is there a high clinical suspicion for MPX-- evidenced by one of the following No  -Unlikely to be chickenpox  -Lymphadenopathy  -Rash that present in same phase of evolution on any given body part See flowsheet for further details and programmatic requirements.   Immunization history:  Immunization History  Administered Date(s) Administered   Hepatitis B 12/31/1991, 12/09/1992, 08/29/2003   Hpv-Unspecified 01/26/2009, 01/27/2010, 06/02/2010   Tdap 04/14/2017, 12/01/2022     The following portions of the patient's history were reviewed and updated as appropriate: allergies, current medications, past medical history, past social history, past surgical history and problem list.  Objective:  There were no vitals filed for this visit.  Physical Exam Vitals and nursing note reviewed.  Constitutional:      Appearance: Normal appearance. She is obese.  HENT:     Head: Normocephalic and atraumatic.     Mouth/Throat:     Mouth: Mucous membranes are moist.     Pharynx: Oropharynx is clear. No oropharyngeal exudate or posterior oropharyngeal erythema.  Eyes:     Conjunctiva/sclera: Conjunctivae normal.  Pulmonary:     Effort: Pulmonary effort is normal.  Abdominal:     Palpations: Abdomen is soft. There is no mass.     Tenderness: There is no abdominal tenderness. There is no rebound.     Comments: Soft without masses or tenderness  Genitourinary:  General: Normal vulva.     Exam position: Lithotomy position.     Pubic Area: No rash or pubic lice.      Labia:        Right: No rash or lesion.        Left: No rash or lesion.      Vagina: Vaginal discharge (small amt white creamy leukorrhea, ph<4.5) present. No erythema, bleeding or lesions.     Cervix: No cervical motion tenderness, discharge, friability, lesion or erythema.     Uterus: Normal.      Adnexa: Right adnexa normal and left adnexa normal.      Rectum: Normal.     Comments: pH = <4.5 Lymphadenopathy:     Head:     Right side of head: No preauricular or posterior auricular adenopathy.     Left side of head: No preauricular or posterior auricular adenopathy.     Cervical: No cervical adenopathy.     Upper Body:     Right upper body: No supraclavicular, axillary or epitrochlear adenopathy.     Left upper body: No supraclavicular, axillary or epitrochlear adenopathy.     Lower Body: No right inguinal adenopathy. No left inguinal adenopathy.  Skin:    General: Skin is warm and dry.     Findings: No rash.  Neurological:     Mental Status: She is alert and oriented to person, place, and time.      Assessment and Plan:  [name] is a 32 y.o. female presenting to the [facility] Department for STI screening  1. Screening examination for venereal disease Treat wet mount per standing orders Immunization nurse consult  - Gonococcus culture - Syphilis Serology, [place] [facility] - HIV/HCV [place] [facility] - Chlamydia/Gonorrhea [place] [facility] - WET PREP FOR TRICH, YEAST, CLUE   Patient accepted all screenings including oral, vaginal CT/GC and bloodwork for HIV/RPR, and wet prep. Patient meets criteria for HepB screening? No. Ordered? no Patient meets criteria for HepC screening? Yes. Ordered? yes  Treat wet prep per standing order Discussed time line for [facility] results and that patient will be called with positive results and encouraged patient to call if she had not heard in 2 weeks.  Counseled to return or seek care for continued or worsening symptoms Recommended repeat testing in 3 months with positive results. Recommended condom use with all sex  Patient is currently using  nothing  to prevent pregnancy.    No follow-ups on file.  No future appointments.  Alberteen Spindle, CNM

## 2023-11-01 NOTE — Progress Notes (Signed)
Pt is here for STD screening , Wet prep results reviewed with pt, no treatment required per standing order. Condoms declined.Sonda Primes, RN.

## 2023-11-08 LAB — GONOCOCCUS CULTURE

## 2023-12-05 ENCOUNTER — Other Ambulatory Visit: Payer: Self-pay

## 2023-12-05 ENCOUNTER — Emergency Department: Payer: Medicaid Other

## 2023-12-05 DIAGNOSIS — R110 Nausea: Secondary | ICD-10-CM | POA: Insufficient documentation

## 2023-12-05 DIAGNOSIS — R519 Headache, unspecified: Secondary | ICD-10-CM | POA: Diagnosis present

## 2023-12-05 NOTE — ED Triage Notes (Signed)
 Pt reports she woke up with a severe headache this morning. Has taken Excedrin and tylenol without relief. Reports headache associated with nausea. Feels different in severity. No recent trauma. No weakness. No vision changes.

## 2023-12-05 NOTE — ED Provider Triage Note (Signed)
 Emergency Medicine Provider Triage Evaluation Note  DASHAUNA HEYMANN , a 33 y.o. female  was evaluated in triage.  Pt complains of severe global headache that started suddenly earlier today.  Patient does have a history of intermittent migraines.  She typically can control them with Excedrin or Tylenol .  Patient states that she had a sudden severe headache that is worse than her typical migraine.  Denies any vision changes, unilateral weakness, slurred speech.  Patient has taken her meds with no relief of headache..  Review of Systems  Positive: Headache Negative: Vision changes, vision loss, unilateral weakness, slurred speech  Physical Exam  BP (!) 143/68 (BP Location: Left Arm)   Pulse 86   Temp 98.6 F (37 C) (Oral)   Resp 16   Ht 5' 4 (1.626 m)   Wt 108 kg   SpO2 100%   BMI 40.85 kg/m  Gen:   Awake, no distress   Resp:  Normal effort  MSK:   Moves extremities without difficulty  Other:  Cranial nerves grossly intact  Medical Decision Making  Medically screening exam initiated at 7:55 PM.  Appropriate orders placed.  LOURDES KUCHARSKI was informed that the remainder of the evaluation will be completed by another provider, this initial triage assessment does not replace that evaluation, and the importance of remaining in the ED until their evaluation is complete.  Patient with sudden severe headache atypical for her migraine pattern.  CT scan is ordered.   Ana Dorn BIRCH, PA-C 12/05/23 1956

## 2023-12-06 ENCOUNTER — Emergency Department
Admission: EM | Admit: 2023-12-06 | Discharge: 2023-12-06 | Disposition: A | Discharge status: Home / Self Care | Payer: Medicaid Other | Attending: Emergency Medicine | Admitting: Emergency Medicine

## 2023-12-06 DIAGNOSIS — R519 Headache, unspecified: Secondary | ICD-10-CM

## 2023-12-06 MED ORDER — METOCLOPRAMIDE HCL 10 MG PO TABS
10.0000 mg | ORAL_TABLET | Freq: Three times a day (TID) | ORAL | 0 refills | Status: DC | PRN
Start: 2023-12-06 — End: 2024-08-29

## 2023-12-06 MED ORDER — BUTALBITAL-APAP-CAFFEINE 50-325-40 MG PO TABS: 1.0000 | ORAL_TABLET | Freq: Four times a day (QID) | ORAL | 0 refills | Status: DC | PRN

## 2023-12-06 NOTE — Discharge Instructions (Signed)

## 2023-12-06 NOTE — ED Provider Notes (Signed)
 [facility] Provider Note    Event Date/Time   First MD Initiated Contact with Patient 12/06/23 [phone]     (approximate)   History   Headache   HPI Peggy Shaw is a 33 y.o. female who presents for evaluation of headache.  She reports that she woke up with a severe headache this morning and has been persistent throughout the day.  Excedrin and Tylenol  have not helped.  She has had associated nausea but no vomiting.  No vision changes, no weakness or numbness in her extremities or in her face.  She has had migraines in the past but not regularly and not for a long time.  No recent medication changes.     Physical Exam   Triage Vital Signs: ED Triage Vitals [12/05/23 1953]  Encounter Vitals Group     BP (!) 143/68     Systolic BP Percentile      Diastolic BP Percentile      Pulse Rate 86     Resp 16     Temp 98.6 F (37 C)     Temp Source Oral     SpO2 100 %     Weight 108 kg (238 lb)     Height 1.626 m (5' 4)     Head Circumference      Peak Flow      Pain Score 10     Pain Loc      Pain Education      Exclude from Growth Chart     Most recent vital signs: Vitals:   12/05/23 1953  BP: (!) 143/68  Pulse: 86  Resp: 16  Temp: 98.6 F (37 C)  SpO2: 100%    General: Awake, no distress.  CV:  Good peripheral perfusion.  Resp:  Normal effort. Speaking easily and comfortably, no accessory muscle usage nor intercostal retractions.   Abd:  No distention.  Other:  Ambulatory without difficulty.  No focal neurological deficits.   ED Results / Procedures / Treatments   Labs (all labs ordered are listed, but only abnormal results are displayed) Labs Reviewed - No data to display   RADIOLOGY I viewed and interpreted the patient's head CT.  There is no evidence of acute intracranial hemorrhage nor intracranial mass.   PROCEDURES:  Critical Care performed: No  Procedures    IMPRESSION / MDM / ASSESSMENT AND PLAN / ED COURSE  I  reviewed the triage vital signs and the nursing notes.                              Differential diagnosis includes, but is not limited to, migraine, cluster headache, nonspecific headache, tension headache, intracranial mass, intracranial bleed.  Patient's presentation is most consistent with acute presentation with potential threat to life or bodily function.  Labs/studies ordered: CT head  Interventions/Medications given:  Medications - No data to display  (Note:  hospital course my include additional interventions and/or labs/studies not listed above.)   Vital signs are within normal limits.  CT head is normal.  Patient's physical exam is generally reassuring and her symptoms are consistent with a migraine headache.  Unfortunate the patient had to wait a very long time for a room and now she needs to go as a result of childcare issues.  I offered her an IV with migraine cocktail treatment to see if it alleviates her pain but she is not able to  do so because of time restrictions.  I suggested the prescription as listed below as an alternative and she knows she can return to the emergency department at anytime for additional evaluation and treatment.  I also referred her to the primary care system.         FINAL CLINICAL IMPRESSION(S) / ED DIAGNOSES   Final diagnoses:  Acute intractable headache, unspecified headache type     Rx / DC Orders   ED Discharge Orders          Ordered    Ambulatory Referral to Primary Care (Establish Care)        12/06/23 0251    butalbital -acetaminophen -caffeine  (FIORICET) 50-325-40 MG tablet  Every 6 hours PRN        12/06/23 0254    metoCLOPramide  (REGLAN ) 10 MG tablet  Every 8 hours PRN        12/06/23 0254             Note:  This document was prepared using Dragon voice recognition software and may include unintentional dictation errors.   Gordan Huxley, MD 12/06/23 [phone]

## 2023-12-25 ENCOUNTER — Encounter: Payer: Self-pay | Admitting: Oncology

## 2023-12-27 ENCOUNTER — Ambulatory Visit: Payer: No Typology Code available for payment source | Admitting: Family Medicine

## 2024-04-18 ENCOUNTER — Ambulatory Visit (INDEPENDENT_AMBULATORY_CARE_PROVIDER_SITE_OTHER): Admitting: Certified Nurse Midwife

## 2024-04-18 ENCOUNTER — Other Ambulatory Visit (HOSPITAL_COMMUNITY)
Admission: RE | Admit: 2024-04-18 | Discharge: 2024-04-18 | Disposition: A | Discharge status: Home / Self Care | Source: Ambulatory Visit | Attending: Certified Nurse Midwife | Admitting: Certified Nurse Midwife

## 2024-04-18 ENCOUNTER — Encounter: Payer: Self-pay | Admitting: Certified Nurse Midwife

## 2024-04-18 VITALS — BP 113/75 | HR 71 | Ht 64.0 in | Wt 231.8 lb

## 2024-04-18 DIAGNOSIS — Z01419 Encounter for gynecological examination (general) (routine) without abnormal findings: Secondary | ICD-10-CM

## 2024-04-18 DIAGNOSIS — Z124 Encounter for screening for malignant neoplasm of cervix: Secondary | ICD-10-CM | POA: Insufficient documentation

## 2024-04-18 DIAGNOSIS — Z113 Encounter for screening for infections with a predominantly sexual mode of transmission: Secondary | ICD-10-CM

## 2024-04-18 DIAGNOSIS — N62 Hypertrophy of breast: Secondary | ICD-10-CM

## 2024-04-18 DIAGNOSIS — Z1151 Encounter for screening for human papillomavirus (HPV): Secondary | ICD-10-CM | POA: Diagnosis present

## 2024-04-18 DIAGNOSIS — G8929 Other chronic pain: Secondary | ICD-10-CM

## 2024-04-18 DIAGNOSIS — Z114 Encounter for screening for human immunodeficiency virus [HIV]: Secondary | ICD-10-CM

## 2024-04-18 DIAGNOSIS — Z1329 Encounter for screening for other suspected endocrine disorder: Secondary | ICD-10-CM

## 2024-04-18 DIAGNOSIS — M549 Dorsalgia, unspecified: Secondary | ICD-10-CM

## 2024-04-18 DIAGNOSIS — Z131 Encounter for screening for diabetes mellitus: Secondary | ICD-10-CM

## 2024-04-18 LAB — CERVICOVAGINAL ANCILLARY ONLY
Chlamydia: NEGATIVE
Comment: NEGATIVE
Comment: NEGATIVE
Comment: NORMAL
Neisseria Gonorrhea: NEGATIVE
Trichomonas: NEGATIVE

## 2024-04-18 NOTE — Patient Instructions (Signed)

## 2024-04-18 NOTE — Progress Notes (Signed)
 ANNUAL EXAM Patient name: Peggy Shaw MRN [id number]  Date of birth: 1991/09/10 Chief Complaint:   Annual Exam  History of Present Illness:   Peggy Shaw is a 33 y.o. [phone] African-American female being seen today for a routine annual exam.  Current complaints: back & shoulder pain daily due to size of breasts despite wearing correctly sized & supportive bra, taking tylenol  multiple times each week with minimal relief, interested in referral for surgical breast reduction. Does not have PCP, requests screening labs & STI screening today.  Patient's last menstrual period was 03/21/2024 (exact date).   Upstream - 04/18/24 0843       Pregnancy Intention Screening   Does the patient want to become pregnant in the next year? No    Does the patient's partner want to become pregnant in the next year? No    Would the patient like to discuss contraceptive options today? No      Contraception Wrap Up   Current Method Withdrawal or Other Method    End Method Withdrawal or Other Method    Contraception Counseling Provided Yes    How was the end contraceptive method provided? N/A            The pregnancy intention screening data noted above was reviewed. Potential methods of contraception were discussed. The patient elected to proceed with Withdrawal or Other Method.   No results found for: "DIAGPAP", "HPVHIGH", "ADEQPAP"     Last pap 2022. Results were: NILM w/ HRHPV not done. H/O abnormal pap: no Last mammogram: n/a due to age. Results were: N/A. Family h/o breast cancer: no Last colonoscopy: n/a due to age. Results were: N/A. Family h/o colorectal cancer: no     04/18/2024   12:23 PM 01/06/2021    3:53 PM  Depression screen PHQ 2/9  Decreased Interest 0 0  Down, Depressed, Hopeless 0 0  PHQ - 2 Score 0 0         No data to display           Review of Systems:   Pertinent items are noted in HPI Denies any headaches, blurred vision, fatigue, shortness of  breath, chest pain, abdominal pain, abnormal vaginal discharge/itching/odor/irritation, problems with periods, bowel movements, urination, or intercourse unless otherwise stated above. Pertinent History Reviewed:  Reviewed past medical,surgical, social and family history.  Reviewed problem list, medications and allergies. Physical Assessment:   Vitals:   04/18/24 0844  BP: 113/75  Pulse: 71  Weight: 231 lb 12.8 oz (105.1 kg)  Height: 5\' 4"  (1.626 m)  Body mass index is 39.79 kg/m.       Physical Exam Vitals reviewed.  Constitutional:      General: She is not in acute distress.    Appearance: Normal appearance.  HENT:     Head: Normocephalic.  Neck:     Thyroid: No thyroid mass or thyromegaly.  Cardiovascular:     Rate and Rhythm: Normal rate and regular rhythm.     Heart sounds: Normal heart sounds.  Pulmonary:     Effort: Pulmonary effort is normal.     Breath sounds: Normal breath sounds.  Chest:  Breasts:    Tanner Score is 5.     Right: Normal.     Left: Normal.  Abdominal:     General: Abdomen is flat.     Palpations: Abdomen is soft.     Tenderness: There is no abdominal tenderness.  Genitourinary:    General: Normal vulva.  Tanner stage (genital): 5.     Vagina: Normal.     Cervix: Normal.  Musculoskeletal:     Cervical back: Neck supple. No tenderness.  Lymphadenopathy:     Upper Body:     Right upper body: No axillary adenopathy.     Left upper body: No axillary adenopathy.  Skin:    General: Skin is warm and dry.  Neurological:     General: No focal deficit present.     Mental Status: She is alert and oriented to person, place, and time.  Psychiatric:        Mood and Affect: Mood normal.        Behavior: Behavior normal.      No results found for this or any previous visit (from the past 24 hours).  Assessment & Plan:  1. Well woman exam (Primary) - Cytology - PAP - Cervicovaginal ancillary only - Comprehensive metabolic panel with  GFR - CBC - RPR - HIV Antibody (routine testing w rflx) - TSH Rfx on Abnormal to Free T4 - VITAMIN D 25 Hydroxy (Vit-D Deficiency, Fractures) - HgB A1c  2. Cervical cancer screening - Cytology - PAP  3. Encounter for screening for human papillomavirus (HPV) - Cytology - PAP  4. Screen for sexually transmitted diseases - Cervicovaginal ancillary only  5. Screening for diabetes mellitus - HgB A1c  6. Screening for thyroid disorder - TSH Rfx on Abnormal to Free T4  7. Screening for human immunodeficiency virus - HIV Antibody (routine testing w rflx)  8. Chronic pain of both shoulders - Ambulatory referral to Plastic Surgery  9. Upper back pain - Ambulatory referral to Plastic Surgery  10. Gynecomastia, female - Ambulatory referral to Plastic Surgery   Mammogram: @ 33yo, or sooner if problems Colonoscopy: @ 33yo, or sooner if problems  Orders Placed This Encounter  Procedures   Comprehensive metabolic panel with GFR   CBC   RPR   HIV Antibody (routine testing w rflx)   TSH Rfx on Abnormal to Free T4   VITAMIN D 25 Hydroxy (Vit-D Deficiency, Fractures)   HgB A1c   Ambulatory referral to Plastic Surgery    Meds: No orders of the defined types were placed in this encounter.   Follow-up: Return in about 1 year (around 04/18/2025) for Annual exam.  Forestine Igo, CNM 04/18/2024 12:28 PM

## 2024-04-19 LAB — HEMOGLOBIN A1C
Est. average glucose Bld gHb Est-mCnc: 117 mg/dL
Hgb A1c MFr Bld: 5.7 % — ABNORMAL HIGH (ref 4.8–5.6)

## 2024-04-19 LAB — CBC
Hematocrit: 36.5 % (ref 34.0–46.6)
Hemoglobin: 11.3 g/dL (ref 11.1–15.9)
MCH: 27.8 pg (ref 26.6–33.0)
MCHC: 31 g/dL — ABNORMAL LOW (ref 31.5–35.7)
MCV: 90 fL (ref 79–97)
Platelets: 269 10*3/uL (ref 150–450)
RBC: 4.07 x10E6/uL (ref 3.77–5.28)
RDW: 13.9 % (ref 11.7–15.4)
WBC: 5.3 10*3/uL (ref 3.4–10.8)

## 2024-04-19 LAB — COMPREHENSIVE METABOLIC PANEL WITH GFR
ALT: 16 IU/L (ref 0–32)
AST: 17 IU/L (ref 0–40)
Albumin: 3.9 g/dL (ref 3.9–4.9)
Alkaline Phosphatase: 84 IU/L (ref 44–121)
BUN/Creatinine Ratio: 16 (ref 9–23)
BUN: 11 mg/dL (ref 6–20)
Bilirubin Total: 0.2 mg/dL (ref 0.0–1.2)
CO2: 23 mmol/L (ref 20–29)
Calcium: 8.8 mg/dL (ref 8.7–10.2)
Chloride: 104 mmol/L (ref 96–106)
Creatinine, Ser: 0.69 mg/dL (ref 0.57–1.00)
Globulin, Total: 2.4 g/dL (ref 1.5–4.5)
Glucose: 86 mg/dL (ref 70–99)
Potassium: 4.1 mmol/L (ref 3.5–5.2)
Sodium: 138 mmol/L (ref 134–144)
Total Protein: 6.3 g/dL (ref 6.0–8.5)
eGFR: 118 mL/min/{1.73_m2} (ref >59)

## 2024-04-19 LAB — HIV ANTIBODY (ROUTINE TESTING W REFLEX): HIV Screen 4th Generation wRfx: NONREACTIVE

## 2024-04-19 LAB — VITAMIN D 25 HYDROXY (VIT D DEFICIENCY, FRACTURES): Vit D, 25-Hydroxy: 19.3 ng/mL — ABNORMAL LOW (ref 30.0–100.0)

## 2024-04-19 LAB — TSH RFX ON ABNORMAL TO FREE T4: TSH: 1.57 u[IU]/mL (ref 0.450–4.500)

## 2024-04-19 LAB — RPR: RPR Ser Ql: NONREACTIVE

## 2024-04-23 ENCOUNTER — Other Ambulatory Visit: Payer: Self-pay | Admitting: Certified Nurse Midwife

## 2024-04-23 ENCOUNTER — Ambulatory Visit: Payer: Self-pay | Admitting: Certified Nurse Midwife

## 2024-04-23 DIAGNOSIS — E559 Vitamin D deficiency, unspecified: Secondary | ICD-10-CM | POA: Insufficient documentation

## 2024-04-23 MED ORDER — VITAMIN D (ERGOCALCIFEROL) 1.25 MG (50000 UNIT) PO CAPS: 50000.0000 [IU] | ORAL_CAPSULE | ORAL | 0 refills | Status: AC

## 2024-04-24 LAB — CYTOLOGY - PAP
Comment: NEGATIVE
Diagnosis: NEGATIVE
High risk HPV: NEGATIVE

## 2024-05-03 NOTE — Telephone Encounter (Signed)
 error

## 2024-05-22 ENCOUNTER — Encounter: Payer: Self-pay | Admitting: Plastic Surgery

## 2024-05-22 ENCOUNTER — Ambulatory Visit (INDEPENDENT_AMBULATORY_CARE_PROVIDER_SITE_OTHER): Admitting: Plastic Surgery

## 2024-05-22 VITALS — BP 108/71 | HR 68 | Ht 64.0 in | Wt 228.8 lb

## 2024-05-22 DIAGNOSIS — N62 Hypertrophy of breast: Secondary | ICD-10-CM

## 2024-05-22 DIAGNOSIS — M542 Cervicalgia: Secondary | ICD-10-CM

## 2024-05-22 DIAGNOSIS — M546 Pain in thoracic spine: Secondary | ICD-10-CM | POA: Diagnosis not present

## 2024-05-22 NOTE — Progress Notes (Signed)
 Referring Provider Peggy Shaw, CNM [address] [place],  [place] [address]   CC:  Chief Complaint  Patient presents with   Consult      Peggy Shaw is an 33 y.o. female.  HPI: Peggy Shaw is a 33 year old female who presents today with complaints of upper back and neck pain secondary to large breast size.  She states that she has always had large breasts however her upper back and neck pain seems to have worsened over the past 2 to 3 years.  She has no other specific breast complaints.  She does note that is difficult to find bras that fit appropriately.  The bras that she does have tend to leave grooves in her shoulders.  She also notes that she often has rashes under her breast and between her breasts.  She is interested in a breast reduction.  She is a smoker.  She does not have significant medical problems including diabetes.  She is not on any blood thinners  No Known Allergies  Outpatient Encounter Medications as of 05/22/2024  Medication Sig   Vitamin D , Ergocalciferol , (DRISDOL ) 1.25 MG (50000 UNIT) CAPS capsule Take 1 capsule (50,000 Units total) by mouth every 7 (seven) days for 6 doses.   butalbital -acetaminophen -caffeine  (FIORICET) 50-325-40 MG tablet Take 1 tablet by mouth every 6 (six) hours as needed for headache. (Patient not taking: Reported on 05/22/2024)   metoCLOPramide  (REGLAN ) 10 MG tablet Take 1 tablet (10 mg total) by mouth every 8 (eight) hours as needed for nausea or vomiting. (Patient not taking: Reported on 05/22/2024)   No facility-administered encounter medications on file as of 05/22/2024.     Past Medical History:  Diagnosis Date   Abnormal uterine bleeding    Migraines     Past Surgical History:  Procedure Laterality Date   HAND SURGERY Right 2015   PERIPHERAL VASCULAR THROMBECTOMY Right 2013   removed from right hand   WISDOM TOOTH EXTRACTION  2020   [age]    Family History  Problem Relation Age of Onset   Seizures Sister     Lung cancer Maternal Aunt    Diabetes Neg Hx    Hypertension Neg Hx    Stroke Neg Hx    Cancer Neg Hx    Thyroid disease Neg Hx     Social History   Social History Narrative   Not on file     Review of Systems General: Denies fevers, chills, weight loss CV: Denies chest pain, shortness of breath, palpitations Breast: Large breasts which contribute to her upper back and neck pain.  Her breasts are frequently in the way and interfere with daily activities.  No history of benign breast disease  Physical Exam    05/22/2024    1:29 PM 04/18/2024    8:44 AM 12/06/2023    3:01 AM  Vitals with BMI  Height 5' 4 5' 4   Weight 228 lbs 13 oz 231 lbs 13 oz   BMI 39.25 39.77   Systolic 108 113   Diastolic 71 75   Pulse 68 71 --    General:  No acute distress,  Alert and oriented, Non-Toxic, Normal speech and affect Breast: Large pendulous breast with grade 3 ptosis.  No dominant masses on physical examination the nipples are normal in appearance without evidence of nipple discharge.  The sternal notch nipple distance on the right is 42 cm and 40 cm on the left the fold and nipple distance on the right  is 22 cm and 20 cm on the left and. Mammogram: Not applicable due to age Assessment/Plan Symptomatic macromastia: Patient has extraordinarily large breasts and would likely benefit from a bilateral breast reduction.  I believe that I can remove between 800 g and 900 g per breast.  I discussed breast reduction at length with the patient.  I showed her the location of the incisions and we discussed the unpredictable nature of scarring and wound healing.  We discussed the risk of bleeding, infection, and seroma formation.  She understands I will use drains postoperatively.  We discussed the risk of nipple loss due to nipple ischemia.  We discussed the fact that breast reductions do not change breast cancer risk.  She understands that breast reductions may make interpretation of mammograms more  difficult.  She will need to wear a supportive compressive garment for 6 weeks postoperatively.  Her postoperative limitations including no heavy lifting greater than 20 pounds, no vigorous activity, no submerging incisions in water  for 6 weeks.  She may return to light activity as tolerated and she will be encouraged to begin ambulation immediately after surgery to help decrease the risk of DVT.  We did discuss the fact that her BMI is right at the limit where we perform a breast reduction.  I have encouraged her to begin a walking program between now and the time that we do surgery to help if not decrease at least maintaining her weight.  She also understands that she will need to be a non-smoker for the surgery and we will test her for nicotine at her preoperative ointment.  If either her nicotine level comes back positive or her weight increases her surgery will be delayed.  All questions were answered to her satisfaction.  Photographs were obtained today with her consent.  Will submit her for bilateral breast reduction at her request.  Peggy Shaw 05/22/2024, 2:45 PM

## 2024-08-29 ENCOUNTER — Ambulatory Visit

## 2024-08-29 DIAGNOSIS — Z113 Encounter for screening for infections with a predominantly sexual mode of transmission: Secondary | ICD-10-CM | POA: Diagnosis not present

## 2024-08-29 LAB — WET PREP FOR TRICH, YEAST, CLUE
Clue Cell Exam: POSITIVE — AB
Trichomonas Exam: NEGATIVE
Yeast Exam: NEGATIVE

## 2024-08-29 LAB — HM HIV SCREENING LAB: HM HIV Screening: NEGATIVE

## 2024-08-29 NOTE — Progress Notes (Signed)
 [facility] Department STI clinic [address] [place] [address] Main phone: [phone]  STI screening visit  Subjective:  Peggy Shaw is a 33 y.o. female being seen today for an STI screening visit. The patient reports they do not have symptoms.  Patient reports that they do not desire a pregnancy in the next year.   They reported they are not interested in discussing contraception today.    Patient's last menstrual period was 08/22/2024 (approximate).  Patient has the following medical conditions:  Patient Active Problem List   Diagnosis Date Noted   Vitamin D  deficiency 04/23/2024   Smoker 5-7 cpd 02/11/2022   IDA (iron  deficiency anemia) 03/13/2017   BMI 40.0-44.9, adult (HCC) 01/30/2017   Chief Complaint  Patient presents with   SEXUALLY TRANSMITTED DISEASE   HPI Patient reports possible exposure to herpes. Last contact with that partner was about 1 month ago, and she was told not by him but by someone else that he has HSV-1. She has not confirmed with him. She has never had an outbreak or any lesions/blisters/bumps consistent with herpes. Desires full STI testing today. No other symptoms, contacts, concerns.  Does the patient using douching products? No  See flowsheet for further details and programmatic requirements Hyperlink available at the top of the signed note in blue.  Flow sheet content below:  Pregnancy Intention Screening Does the patient want to become pregnant in the next year?: No Does the patient's partner want to become pregnant in the next year?: No Would the patient like to discuss contraceptive options today?: No Reason For STD Screen STD Screening: Is asymptomatic Have you ever had an STD?: Yes History of Antibiotic use in the past 2 weeks?: No STD Symptoms Denies all: Yes Risk Factors for Hep B Household, sexual, or needle sharing contact of a person infected with Hep B: No Sexual contact with a  person who uses drugs not as prescribed?: No Currently or Ever used drugs not as prescribed: No HIV Positive: No PRep Patient: No Men who have sex with men: No Have Hepatitis C: No History of Incarceration: No History of Homeslessness?: No Anal sex following anal drug use?: No Risk Factors for Hep C Currently using drugs not as prescribed: No Sexual partner(s) currently using drugs as not prescribed: No History of drug use: No HIV Positive: No People with a history of incarceration: No People born between the years of 67 and 21: No Counseling Patient counseled to use condoms with all sex: Condoms declined RTC in 2-3 weeks for test results: Yes Clinic will call if test results abnormal before test result appt.: Yes Immunizations: Referred Test results given to patient Patient counseled to use condoms with all sex: Condoms declined   Screening for MPX risk: Does the patient have an unexplained rash? No Is the patient MSM? No Does the patient endorse multiple sex partners or anonymous sex partners? No Did the patient have close or sexual contact with a person diagnosed with MPX? No Has the patient traveled outside the [place]  where MPX is endemic? No Is there a high clinical suspicion for MPX-- evidenced by one of the following No  -Unlikely to be chickenpox  -Lymphadenopathy  -Rash that present in same phase of evolution on any given body part  Screenings: Last HIV test per patient/review of record was  Lab Results  Component Value Date   HMHIVSCREEN Negative - Validated 11/01/2023    Lab Results  Component Value Date  HIV Non Reactive 04/18/2024     Last HEPC test per patient/review of record was  Lab Results  Component Value Date   HMHEPCSCREEN Negative-Validated 11/01/2023   No components found for: HEPC   Last HEPB test per patient/review of record was No components found for: HMHEPBSCREEN   Patient reports last pap was:   Lab Results  Component Value  Date   SPECADGYN Comment 01/06/2021   Result Date Procedure Results Follow-ups  04/18/2024 Cytology - PAP High risk HPV: Negative Adequacy: Satisfactory for evaluation; transformation zone component PRESENT. Diagnosis: - Negative for intraepithelial lesion or malignancy (NILM) Comment: Normal Reference Range HPV - Negative   01/06/2021 IGP, rfx Aptima HPV ASCU DIAGNOSIS:: Comment Specimen adequacy:: Comment Clinician Provided ICD10: Comment Performed by:: Comment PAP Smear Comment: . Note:: Comment Test Methodology: Comment PAP Reflex: Comment   03/13/2018 Pap IG, CT/NG w/ reflex HPV when ASC-U DIAGNOSIS:: Comment Specimen adequacy:: Comment Clinician Provided ICD10: Comment Performed by:: Comment PAP Smear Comment: . Note:: Comment Test Methodology: Comment PAP Reflex: Comment Chlamydia, Nuc. Acid Amp: Negative Gonococcus by Nucleic Acid Amp: Negative     Immunization history:  Immunization History  Administered Date(s) Administered   Hepatitis B 12/31/1991, 12/09/1992, 08/29/2003   Hpv-Unspecified 01/26/2009, 01/27/2010, 06/02/2010   Tdap 04/14/2017, 12/01/2022    The following portions of the patient's history were reviewed and updated as appropriate: allergies, current medications, past medical history, past social history, past surgical history and problem list.  Objective:  There were no vitals filed for this visit.  Physical Exam Vitals and nursing note reviewed. Exam conducted with a chaperone present Brett Orange).  Constitutional:      Appearance: Normal appearance.  HENT:     Head: Normocephalic and atraumatic.     Mouth/Throat:     Mouth: Mucous membranes are moist.     Pharynx: Oropharynx is clear. No oropharyngeal exudate or posterior oropharyngeal erythema.  Pulmonary:     Effort: Pulmonary effort is normal.  Abdominal:     General: Abdomen is flat.     Palpations: There is no mass.     Tenderness: There is no abdominal tenderness. There is no rebound.   Genitourinary:    General: Normal vulva.     Exam position: Lithotomy position.     Pubic Area: No rash or pubic lice.      Labia:        Right: No rash or lesion.        Left: No rash or lesion.      Vagina: Normal. No vaginal discharge, erythema, bleeding or lesions.     Cervix: No cervical motion tenderness, discharge, friability, lesion or erythema.     Rectum: Normal.     Comments: pH = <4.5 Lymphadenopathy:     Head:     Right side of head: No preauricular or posterior auricular adenopathy.     Left side of head: No preauricular or posterior auricular adenopathy.     Cervical: No cervical adenopathy.     Upper Body:     Right upper body: No supraclavicular, axillary or epitrochlear adenopathy.     Left upper body: No supraclavicular, axillary or epitrochlear adenopathy.     Lower Body: No right inguinal adenopathy. No left inguinal adenopathy.  Skin:    General: Skin is warm and dry.     Findings: No rash.  Neurological:     Mental Status: She is alert and oriented to person, place, and time.    Assessment and  Plan:  Peggy Shaw is a 33 y.o. female presenting to the [facility] Department for STI screening  1. Screening for venereal disease (Primary) - Reassured regarding possible herpes exposure, discussed to return if she develops any lesions. Discussed herpes at length.  - WET PREP FOR TRICH, YEAST, CLUE - HIV [place] [facility] - Syphilis Serology, [place] [facility] - Chlamydia/Gonorrhea [place] [facility] - Gonococcus culture - Gonococcus culture   Patient accepted the following screenings: anal GC culture, oral GC culture, vaginal CT/GC swab, vaginal wet prep, HIV, and RPR Patient meets criteria for HepB screening? No. Ordered? no Patient meets criteria for HepC screening? No. Ordered? no  Treat wet prep per standing order Discussed time line for [facility] results and that patient will be called with positive results and encouraged patient to call if she had  not heard in 2 weeks.  Counseled to return or seek care for continued or worsening symptoms Recommended repeat testing in 3 months with positive results. Recommended condom use with all sex for STI prevention.   Return if symptoms worsen or fail to improve.  Future Appointments  Date Time Provider Department Center  09/16/2024  1:00 PM Andris Estefana FORBES DEVONNA [facility] None  10/02/2024 10:15 AM Landy Honora LITTIE, PA-C [facility] None  10/07/2024 10:15 AM Waddell Leonce NOVAK, MD [facility] None  10/21/2024 10:30 AM Landy Honora LITTIE, PA-C [facility] None    Damien FORBES Satchel, NP

## 2024-08-29 NOTE — Progress Notes (Signed)
 Pt is here STD screening. Wet Prep results reviewed with pt and requires no treatment. Condoms declined. Wilkie Drought, RN.

## 2024-09-03 LAB — GONOCOCCUS CULTURE

## 2024-09-16 ENCOUNTER — Encounter: Admitting: Physician Assistant

## 2024-09-16 ENCOUNTER — Ambulatory Visit (INDEPENDENT_AMBULATORY_CARE_PROVIDER_SITE_OTHER): Admitting: Physician Assistant

## 2024-09-16 VITALS — BP 116/81 | HR 65 | Ht 64.0 in | Wt 227.1 lb

## 2024-09-16 DIAGNOSIS — N62 Hypertrophy of breast: Secondary | ICD-10-CM

## 2024-09-16 DIAGNOSIS — Z720 Tobacco use: Secondary | ICD-10-CM

## 2024-09-16 MED ORDER — ONDANSETRON 4 MG PO TBDP: 4.0000 mg | ORAL_TABLET | Freq: Three times a day (TID) | ORAL | 0 refills | Status: DC | PRN

## 2024-09-16 MED ORDER — OXYCODONE HCL 5 MG PO TABS: 5.0000 mg | ORAL_TABLET | Freq: Three times a day (TID) | ORAL | 0 refills | Status: AC | PRN

## 2024-09-16 NOTE — H&P (View-Only) (Signed)
 Patient ID: Peggy Shaw, female    DOB: 06/18/1991, 33 y.o.   MRN: [id number]  Chief Complaint  Patient presents with   Pre-op Exam      ICD-10-CM   1. Macromastia  N62     2. Nicotine use  Z72.0 Nicotine/cotinine metabolites    CANCELED: Nicotine/cotinine metabolites       History of Present Illness: Peggy Shaw is a 33 y.o.  female  with a history of macromastia.  She presents for preoperative evaluation for upcoming procedure, bilateral breast reduction, scheduled for 10/01/2024 with Dr. Waddell.  The patient has not had problems with anesthesia.  Previous ganglion cyst excision without complication.  She has not smoked or used any nicotine containing products for the past 4 months.  She will complete her nicotine test in the next couple days.  She denies any personal or family history of blood clots or clotting disorder.  Denies any personal history of cardiac or pulmonary disease, inflammatory bowel disease, varicosities, or cancer.  She does not take any medications, vitamins, or supplements.  Her mother will be assisting with the postoperative recovery.  She confirms that she is a 44K cup and ideally would be a C cup postoperatively.  She understands the limitations with how much tissue can be safely excised in order to preserve shape of breasts and nipple viability.  She understands that in all likelihood she will be still much larger than a C cup given her current breast size.  We also discussed nipple areolar necrosis as a risk as well as the possibility of converting to free nipple graft/amputation technique intraoperatively if there is evidence of ischemia.  She is understanding and agreeable.  Summary of Previous Visit: Patient was seen for consult by Dr. Waddell on 05/22/2024.  At that time, complained of chronic upper back and neck discomfort in the context of large breasts.  STN 42 cm on the right, 40 cm on the left.  NTF 22 cm in the right, 20 cm in the left.  She was  also advised that a nicotine/cotinine test would need to be obtained at the preoperative appointment.  Estimated tissue to be removed at time of surgery equals 800 to 900 g each side.  Job: Works in Teacher, music, computer-based position.  Denies any lifting or pushing.  2 weeks FMLA/STD.  PMH Significant for: Macromastia, nicotine use disorder, right hand ganglion cyst excision, obesity, vitamin D  deficiency.   Past Medical History: Allergies: No Known Allergies  Current Medications:  Current Outpatient Medications:    ondansetron  (ZOFRAN -ODT) 4 MG disintegrating tablet, Take 1 tablet (4 mg total) by mouth every 8 (eight) hours as needed for nausea or vomiting., Disp: 20 tablet, Rfl: 0   oxyCODONE (ROXICODONE) 5 MG immediate release tablet, Take 1 tablet (5 mg total) by mouth every 8 (eight) hours as needed for up to 5 days for severe pain (pain score 7-10)., Disp: 15 tablet, Rfl: 0  Past Medical Problems: Past Medical History:  Diagnosis Date   Abnormal uterine bleeding    Migraines     Past Surgical History: Past Surgical History:  Procedure Laterality Date   HAND SURGERY Right 2015   PERIPHERAL VASCULAR THROMBECTOMY Right 2013   removed from right hand   WISDOM TOOTH EXTRACTION  2020   [age]    Social History: Social History   Socioeconomic History   Marital status: Single    Spouse name: Not on file   Number of children: 2  Years of education: 45   Highest education level: Not on file  Occupational History   Occupation: healthcare  Tobacco Use   Smoking status: Every Day    Current packs/day: 0.00    Types: Cigarettes    Last attempt to quit: 03/04/2018    Years since quitting: 6.5   Smokeless tobacco: Never  Vaping Use   Vaping status: Never Used  Substance and Sexual Activity   Alcohol use: Yes    Alcohol/week: 3.0 standard drinks of alcohol    Types: 3 Shots of liquor per week    Comment: soc   Drug use: No   Sexual activity: Not Currently    Partners:  Male    Birth control/protection: None  Other Topics Concern   Not on file  Social History Narrative   Not on file   Social Drivers of Health   Financial Resource Strain: High Risk (08/08/2022)   Overall Financial Resource Strain (CARDIA)    Difficulty of Paying Living Expenses: Hard  Food Insecurity: No Food Insecurity (02/15/2023)   Hunger Vital Sign    Worried About Running Out of Food in the Last Year: Never true    Ran Out of Food in the Last Year: Never true  Transportation Needs: No Transportation Needs (02/15/2023)   PRAPARE - Administrator, Civil Service (Medical): No    Lack of Transportation (Non-Medical): No  Physical Activity: Insufficiently Active (08/08/2022)   Exercise Vital Sign    Days of Exercise per Week: 2 days    Minutes of Exercise per Session: 30 min  Stress: No Stress Concern Present (08/08/2022)   [employer] of Occupational Health - Occupational Stress Questionnaire    Feeling of Stress : Not at all  Social Connections: Unknown (08/08/2022)   Social Connection and Isolation Panel    Frequency of Communication with Friends and Family: Twice a week    Frequency of Social Gatherings with Friends and Family: Once a week    Attends Religious Services: Never    Database administrator or Organizations: No    Attends Banker Meetings: Never    Marital Status: Not on file  Intimate Partner Violence: Not At Risk (02/15/2023)   Humiliation, Afraid, Rape, and Kick questionnaire    Fear of Current or Ex-Partner: No    Emotionally Abused: No    Physically Abused: No    Sexually Abused: No    Family History: Family History  Problem Relation Age of Onset   Seizures Sister    Lung cancer Maternal Aunt    Diabetes Neg Hx    Hypertension Neg Hx    Stroke Neg Hx    Cancer Neg Hx    Thyroid disease Neg Hx     Review of Systems: ROS Denies any recent chest pain, infection, trauma, or fevers.  Physical Exam: Vital Signs BP  116/81   Pulse 65   Ht 5' 4 (1.626 m)   Wt 227 lb 2 oz (103 kg)   LMP 08/22/2024 (Approximate)   SpO2 98%   BMI 38.99 kg/m   Physical Exam Constitutional:      General: Not in acute distress.    Appearance: Normal appearance. Not ill-appearing.  HENT:     Head: Normocephalic and atraumatic.  Eyes:     Pupils: Pupils are equal, round. Cardiovascular:     Rate and Rhythm: Normal rate.    Pulses: Normal pulses.  Pulmonary:     Effort: No respiratory distress  or increased work of breathing.  Speaks in full sentences. Abdominal:     General: Abdomen is flat. No distension.   Musculoskeletal: Normal range of motion. No lower extremity swelling or edema. No varicosities. Skin:    General: Skin is warm and dry.     Findings: No erythema or rash.  Neurological:     Mental Status: Alert and oriented to person, place, and time.  Psychiatric:        Mood and Affect: Mood normal.        Behavior: Behavior normal.    Assessment/Plan: The patient is scheduled for bilateral breast reduction with Dr. Waddell.  Risks, benefits, and alternatives of procedure discussed, questions answered and consent obtained.    Smoking Status: Former smoker, last nicotine use was 4 months ago; Counseling Given?  She understands the risks of nicotine with regard to wound healing and will continue to abstain postoperatively.  Last Mammogram: N/A due to age.  Caprini Score: 3; Risk Factors include: BMI greater than 25 and length of planned surgery. Recommendation for mechanical prophylaxis. Encourage early ambulation.   Pictures obtained: 05/22/2024  Post-op Rx sent to pharmacy: Oxycodone and Zofran   Patient was provided with the General Surgical Risk consent document and Pain Medication Agreement prior to their appointment.  They had adequate time to read through the risk consent documents and Pain Medication Agreement. We also discussed them in person together during this preop appointment. All of their  questions were answered to their satisfaction.  Recommended calling if they have any further questions.  Risk consent form and Pain Medication Agreement to be scanned into patient's chart.  The risk that can be encountered with breast reduction were discussed and include the following but not limited to these:  Breast asymmetry, fluid accumulation, firmness of the breast, inability to breast feed, loss of nipple or areola, skin loss, decrease or no nipple sensation, fat necrosis of the breast tissue, bleeding, infection, healing delay.  There are risks of anesthesia, changes to skin sensation and injury to nerves or blood vessels.  The muscle can be temporarily or permanently injured.  You may have an allergic reaction to tape, suture, glue, blood products which can result in skin discoloration, swelling, pain, skin lesions, poor healing.  Any of these can lead to the need for revisonal surgery or stage procedures.  A reduction has potential to interfere with diagnostic procedures.  Nipple or breast piercing can increase risks of infection.  This procedure is best done when the breast is fully developed.  Changes in the breast will continue to occur over time.  Pregnancy can alter the outcomes of previous breast reduction surgery, weight gain and weigh loss can also effect the long term appearance.   We discussed the possibility of amputation/free nipple graft technique due to the length of her STN.  She is understanding of the possibility that we would need to transition from a pedicle technique to a free nipple graft technique intraoperatively.  We discussed the risks associated with free nipple graft breast reductions, including but not limited to failure of the graft, partial loss of the graft, loss of sensation of bilateral nipple areola, complete loss of the nipple areola graft, inability to breast-feed, postoperative wounds, ongoing wound care.  We also discussed the risks associated with the pedicle  technique.  We discussed that with the pedicle technique she could develop nipple areolar necrosis which would result in loss of the nipple, this would also result in ongoing wound care  and possible changes in the shape of her breast.      Electronically signed by: Honora Seip, PA-C 09/16/2024 2:44 PM

## 2024-09-16 NOTE — Progress Notes (Signed)
 Patient ID: Peggy Shaw, female    DOB: 06/18/1991, 33 y.o.   MRN: [id number]  Chief Complaint  Patient presents with   Pre-op Exam      ICD-10-CM   1. Macromastia  N62     2. Nicotine use  Z72.0 Nicotine/cotinine metabolites    CANCELED: Nicotine/cotinine metabolites       History of Present Illness: Peggy Shaw is a 33 y.o.  female  with a history of macromastia.  She presents for preoperative evaluation for upcoming procedure, bilateral breast reduction, scheduled for 10/01/2024 with Dr. Waddell.  The patient has not had problems with anesthesia.  Previous ganglion cyst excision without complication.  She has not smoked or used any nicotine containing products for the past 4 months.  She will complete her nicotine test in the next couple days.  She denies any personal or family history of blood clots or clotting disorder.  Denies any personal history of cardiac or pulmonary disease, inflammatory bowel disease, varicosities, or cancer.  She does not take any medications, vitamins, or supplements.  Her mother will be assisting with the postoperative recovery.  She confirms that she is a 44K cup and ideally would be a C cup postoperatively.  She understands the limitations with how much tissue can be safely excised in order to preserve shape of breasts and nipple viability.  She understands that in all likelihood she will be still much larger than a C cup given her current breast size.  We also discussed nipple areolar necrosis as a risk as well as the possibility of converting to free nipple graft/amputation technique intraoperatively if there is evidence of ischemia.  She is understanding and agreeable.  Summary of Previous Visit: Patient was seen for consult by Dr. Waddell on 05/22/2024.  At that time, complained of chronic upper back and neck discomfort in the context of large breasts.  STN 42 cm on the right, 40 cm on the left.  NTF 22 cm in the right, 20 cm in the left.  She was  also advised that a nicotine/cotinine test would need to be obtained at the preoperative appointment.  Estimated tissue to be removed at time of surgery equals 800 to 900 g each side.  Job: Works in Teacher, music, computer-based position.  Denies any lifting or pushing.  2 weeks FMLA/STD.  PMH Significant for: Macromastia, nicotine use disorder, right hand ganglion cyst excision, obesity, vitamin D  deficiency.   Past Medical History: Allergies: No Known Allergies  Current Medications:  Current Outpatient Medications:    ondansetron  (ZOFRAN -ODT) 4 MG disintegrating tablet, Take 1 tablet (4 mg total) by mouth every 8 (eight) hours as needed for nausea or vomiting., Disp: 20 tablet, Rfl: 0   oxyCODONE (ROXICODONE) 5 MG immediate release tablet, Take 1 tablet (5 mg total) by mouth every 8 (eight) hours as needed for up to 5 days for severe pain (pain score 7-10)., Disp: 15 tablet, Rfl: 0  Past Medical Problems: Past Medical History:  Diagnosis Date   Abnormal uterine bleeding    Migraines     Past Surgical History: Past Surgical History:  Procedure Laterality Date   HAND SURGERY Right 2015   PERIPHERAL VASCULAR THROMBECTOMY Right 2013   removed from right hand   WISDOM TOOTH EXTRACTION  2020   four    Social History: Social History   Socioeconomic History   Marital status: Single    Spouse name: Not on file   Number of children: 2  Years of education: 45   Highest education level: Not on file  Occupational History   Occupation: healthcare  Tobacco Use   Smoking status: Every Day    Current packs/day: 0.00    Types: Cigarettes    Last attempt to quit: 03/04/2018    Years since quitting: 6.5   Smokeless tobacco: Never  Vaping Use   Vaping status: Never Used  Substance and Sexual Activity   Alcohol use: Yes    Alcohol/week: 3.0 standard drinks of alcohol    Types: 3 Shots of liquor per week    Comment: soc   Drug use: No   Sexual activity: Not Currently    Partners:  Male    Birth control/protection: None  Other Topics Concern   Not on file  Social History Narrative   Not on file   Social Drivers of Health   Financial Resource Strain: High Risk (08/08/2022)   Overall Financial Resource Strain (CARDIA)    Difficulty of Paying Living Expenses: Hard  Food Insecurity: No Food Insecurity (02/15/2023)   Hunger Vital Sign    Worried About Running Out of Food in the Last Year: Never true    Ran Out of Food in the Last Year: Never true  Transportation Needs: No Transportation Needs (02/15/2023)   PRAPARE - Administrator, Civil Service (Medical): No    Lack of Transportation (Non-Medical): No  Physical Activity: Insufficiently Active (08/08/2022)   Exercise Vital Sign    Days of Exercise per Week: 2 days    Minutes of Exercise per Session: 30 min  Stress: No Stress Concern Present (08/08/2022)   [employer] of Occupational Health - Occupational Stress Questionnaire    Feeling of Stress : Not at all  Social Connections: Unknown (08/08/2022)   Social Connection and Isolation Panel    Frequency of Communication with Friends and Family: Twice a week    Frequency of Social Gatherings with Friends and Family: Once a week    Attends Religious Services: Never    Database administrator or Organizations: No    Attends Banker Meetings: Never    Marital Status: Not on file  Intimate Partner Violence: Not At Risk (02/15/2023)   Humiliation, Afraid, Rape, and Kick questionnaire    Fear of Current or Ex-Partner: No    Emotionally Abused: No    Physically Abused: No    Sexually Abused: No    Family History: Family History  Problem Relation Age of Onset   Seizures Sister    Lung cancer Maternal Aunt    Diabetes Neg Hx    Hypertension Neg Hx    Stroke Neg Hx    Cancer Neg Hx    Thyroid disease Neg Hx     Review of Systems: ROS Denies any recent chest pain, infection, trauma, or fevers.  Physical Exam: Vital Signs BP  116/81   Pulse 65   Ht 5' 4 (1.626 m)   Wt 227 lb 2 oz (103 kg)   LMP 08/22/2024 (Approximate)   SpO2 98%   BMI 38.99 kg/m   Physical Exam Constitutional:      General: Not in acute distress.    Appearance: Normal appearance. Not ill-appearing.  HENT:     Head: Normocephalic and atraumatic.  Eyes:     Pupils: Pupils are equal, round. Cardiovascular:     Rate and Rhythm: Normal rate.    Pulses: Normal pulses.  Pulmonary:     Effort: No respiratory distress  or increased work of breathing.  Speaks in full sentences. Abdominal:     General: Abdomen is flat. No distension.   Musculoskeletal: Normal range of motion. No lower extremity swelling or edema. No varicosities. Skin:    General: Skin is warm and dry.     Findings: No erythema or rash.  Neurological:     Mental Status: Alert and oriented to person, place, and time.  Psychiatric:        Mood and Affect: Mood normal.        Behavior: Behavior normal.    Assessment/Plan: The patient is scheduled for bilateral breast reduction with Dr. Waddell.  Risks, benefits, and alternatives of procedure discussed, questions answered and consent obtained.    Smoking Status: Former smoker, last nicotine use was 4 months ago; Counseling Given?  She understands the risks of nicotine with regard to wound healing and will continue to abstain postoperatively.  Last Mammogram: N/A due to age.  Caprini Score: 3; Risk Factors include: BMI greater than 25 and length of planned surgery. Recommendation for mechanical prophylaxis. Encourage early ambulation.   Pictures obtained: 05/22/2024  Post-op Rx sent to pharmacy: Oxycodone and Zofran   Patient was provided with the General Surgical Risk consent document and Pain Medication Agreement prior to their appointment.  They had adequate time to read through the risk consent documents and Pain Medication Agreement. We also discussed them in person together during this preop appointment. All of their  questions were answered to their satisfaction.  Recommended calling if they have any further questions.  Risk consent form and Pain Medication Agreement to be scanned into patient's chart.  The risk that can be encountered with breast reduction were discussed and include the following but not limited to these:  Breast asymmetry, fluid accumulation, firmness of the breast, inability to breast feed, loss of nipple or areola, skin loss, decrease or no nipple sensation, fat necrosis of the breast tissue, bleeding, infection, healing delay.  There are risks of anesthesia, changes to skin sensation and injury to nerves or blood vessels.  The muscle can be temporarily or permanently injured.  You may have an allergic reaction to tape, suture, glue, blood products which can result in skin discoloration, swelling, pain, skin lesions, poor healing.  Any of these can lead to the need for revisonal surgery or stage procedures.  A reduction has potential to interfere with diagnostic procedures.  Nipple or breast piercing can increase risks of infection.  This procedure is best done when the breast is fully developed.  Changes in the breast will continue to occur over time.  Pregnancy can alter the outcomes of previous breast reduction surgery, weight gain and weigh loss can also effect the long term appearance.   We discussed the possibility of amputation/free nipple graft technique due to the length of her STN.  She is understanding of the possibility that we would need to transition from a pedicle technique to a free nipple graft technique intraoperatively.  We discussed the risks associated with free nipple graft breast reductions, including but not limited to failure of the graft, partial loss of the graft, loss of sensation of bilateral nipple areola, complete loss of the nipple areola graft, inability to breast-feed, postoperative wounds, ongoing wound care.  We also discussed the risks associated with the pedicle  technique.  We discussed that with the pedicle technique she could develop nipple areolar necrosis which would result in loss of the nipple, this would also result in ongoing wound care  and possible changes in the shape of her breast.      Electronically signed by: Honora Seip, PA-C 09/16/2024 2:44 PM

## 2024-09-24 ENCOUNTER — Other Ambulatory Visit: Payer: Self-pay

## 2024-09-24 ENCOUNTER — Encounter (HOSPITAL_BASED_OUTPATIENT_CLINIC_OR_DEPARTMENT_OTHER): Payer: Self-pay | Admitting: Plastic Surgery

## 2024-09-26 ENCOUNTER — Encounter (HOSPITAL_BASED_OUTPATIENT_CLINIC_OR_DEPARTMENT_OTHER)
Admission: RE | Admit: 2024-09-26 | Discharge: 2024-09-26 | Disposition: A | Discharge status: Home / Self Care | Source: Ambulatory Visit | Attending: Plastic Surgery | Admitting: Plastic Surgery

## 2024-09-26 DIAGNOSIS — Z01812 Encounter for preprocedural laboratory examination: Secondary | ICD-10-CM | POA: Diagnosis present

## 2024-09-26 LAB — POCT PREGNANCY, URINE: Preg Test, Ur: NEGATIVE

## 2024-09-27 LAB — NICOTINE/COTININE METABOLITES
Cotinine: <1 ng/mL
Nicotine: <1 ng/mL

## 2024-10-01 ENCOUNTER — Ambulatory Visit (HOSPITAL_BASED_OUTPATIENT_CLINIC_OR_DEPARTMENT_OTHER)
Admission: RE | Admit: 2024-10-01 | Discharge: 2024-10-01 | Disposition: A | Discharge status: Home / Self Care | Attending: Plastic Surgery | Admitting: Plastic Surgery

## 2024-10-01 ENCOUNTER — Ambulatory Visit (HOSPITAL_BASED_OUTPATIENT_CLINIC_OR_DEPARTMENT_OTHER): Payer: Self-pay | Admitting: Certified Registered"

## 2024-10-01 ENCOUNTER — Other Ambulatory Visit: Payer: Self-pay

## 2024-10-01 ENCOUNTER — Encounter (HOSPITAL_BASED_OUTPATIENT_CLINIC_OR_DEPARTMENT_OTHER): Payer: Self-pay | Admitting: Plastic Surgery

## 2024-10-01 ENCOUNTER — Encounter (HOSPITAL_BASED_OUTPATIENT_CLINIC_OR_DEPARTMENT_OTHER)
Admission: RE | Disposition: A | Discharge status: Home / Self Care | Payer: Self-pay | Source: Home / Self Care | Attending: Plastic Surgery

## 2024-10-01 DIAGNOSIS — N62 Hypertrophy of breast: Secondary | ICD-10-CM

## 2024-10-01 DIAGNOSIS — Z87891 Personal history of nicotine dependence: Secondary | ICD-10-CM | POA: Insufficient documentation

## 2024-10-01 DIAGNOSIS — Z59869 Financial insecurity, unspecified: Secondary | ICD-10-CM | POA: Insufficient documentation

## 2024-10-01 DIAGNOSIS — M546 Pain in thoracic spine: Secondary | ICD-10-CM | POA: Diagnosis not present

## 2024-10-01 DIAGNOSIS — M549 Dorsalgia, unspecified: Secondary | ICD-10-CM | POA: Insufficient documentation

## 2024-10-01 DIAGNOSIS — Z01818 Encounter for other preprocedural examination: Secondary | ICD-10-CM

## 2024-10-01 DIAGNOSIS — M542 Cervicalgia: Secondary | ICD-10-CM | POA: Diagnosis not present

## 2024-10-01 HISTORY — PX: BREAST REDUCTION SURGERY: SHX8

## 2024-10-01 LAB — POCT PREGNANCY, URINE: Preg Test, Ur: NEGATIVE

## 2024-10-01 SURGERY — MAMMOPLASTY, REDUCTION
Anesthesia: General | Site: Breast | Laterality: Bilateral

## 2024-10-01 MED ORDER — HYDROMORPHONE HCL 1 MG/ML IJ SOLN
INTRAMUSCULAR | Status: AC
  Filled 2024-10-01: qty 0.5

## 2024-10-01 MED ORDER — ROCURONIUM BROMIDE 100 MG/10ML IV SOLN
INTRAVENOUS | Status: DC | PRN
  Administered 2024-10-01: 70 mg via INTRAVENOUS

## 2024-10-01 MED ORDER — HYDROMORPHONE HCL 1 MG/ML IJ SOLN
INTRAMUSCULAR | Status: DC | PRN
  Administered 2024-10-01 (×2): .5 mg via INTRAVENOUS

## 2024-10-01 MED ORDER — CELECOXIB 200 MG PO CAPS
200.0000 mg | ORAL_CAPSULE | Freq: Once | ORAL | Status: AC
  Administered 2024-10-01: 200 mg via ORAL

## 2024-10-01 MED ORDER — ONDANSETRON HCL 4 MG/2ML IJ SOLN
INTRAMUSCULAR | Status: AC
  Filled 2024-10-01: qty 2

## 2024-10-01 MED ORDER — MIDAZOLAM HCL 5 MG/5ML IJ SOLN
INTRAMUSCULAR | Status: DC | PRN
  Administered 2024-10-01: 2 mg via INTRAVENOUS

## 2024-10-01 MED ORDER — ONDANSETRON HCL 4 MG/2ML IJ SOLN
INTRAMUSCULAR | Status: DC | PRN
  Administered 2024-10-01: 4 mg via INTRAVENOUS

## 2024-10-01 MED ORDER — ACETAMINOPHEN 500 MG PO TABS
1000.0000 mg | ORAL_TABLET | Freq: Once | ORAL | Status: AC
  Administered 2024-10-01: 1000 mg via ORAL

## 2024-10-01 MED ORDER — PROPOFOL 10 MG/ML IV BOLUS
INTRAVENOUS | Status: AC
  Filled 2024-10-01: qty 20

## 2024-10-01 MED ORDER — DEXMEDETOMIDINE HCL IN NACL 80 MCG/20ML IV SOLN
INTRAVENOUS | Status: DC | PRN
  Administered 2024-10-01: 8 ug via INTRAVENOUS
  Administered 2024-10-01: 12 ug via INTRAVENOUS

## 2024-10-01 MED ORDER — CHLORHEXIDINE GLUCONATE CLOTH 2 % EX PADS: 6.0000 | MEDICATED_PAD | Freq: Once | CUTANEOUS | Status: DC

## 2024-10-01 MED ORDER — PROPOFOL 10 MG/ML IV BOLUS
INTRAVENOUS | Status: DC | PRN
  Administered 2024-10-01: 100 mg via INTRAVENOUS

## 2024-10-01 MED ORDER — BUPIVACAINE HCL (PF) 0.25 % IJ SOLN
INTRAMUSCULAR | Status: AC
Start: 2024-10-01 — End: 2024-10-01
  Filled 2024-10-01: qty 30

## 2024-10-01 MED ORDER — LIDOCAINE 2% (20 MG/ML) 5 ML SYRINGE
INTRAMUSCULAR | Status: AC
Start: 2024-10-01 — End: 2024-10-01
  Filled 2024-10-01: qty 5

## 2024-10-01 MED ORDER — FENTANYL CITRATE (PF) 100 MCG/2ML IJ SOLN
INTRAMUSCULAR | Status: DC | PRN
  Administered 2024-10-01 (×4): 50 ug via INTRAVENOUS

## 2024-10-01 MED ORDER — LACTATED RINGERS IV SOLN: INTRAVENOUS | Status: DC

## 2024-10-01 MED ORDER — DEXMEDETOMIDINE HCL IN NACL 80 MCG/20ML IV SOLN
INTRAVENOUS | Status: AC
  Filled 2024-10-01: qty 20

## 2024-10-01 MED ORDER — DEXAMETHASONE SOD PHOSPHATE PF 10 MG/ML IJ SOLN
INTRAMUSCULAR | Status: DC | PRN
  Administered 2024-10-01: 10 mg via INTRAVENOUS

## 2024-10-01 MED ORDER — MIDAZOLAM HCL 2 MG/2ML IJ SOLN
INTRAMUSCULAR | Status: AC
  Filled 2024-10-01: qty 2

## 2024-10-01 MED ORDER — FENTANYL CITRATE (PF) 100 MCG/2ML IJ SOLN
25.0000 ug | INTRAMUSCULAR | Status: DC | PRN
  Administered 2024-10-01: 50 ug via INTRAVENOUS

## 2024-10-01 MED ORDER — OXYCODONE HCL 5 MG/5ML PO SOLN: 5.0000 mg | Freq: Once | ORAL | Status: AC | PRN

## 2024-10-01 MED ORDER — ACETAMINOPHEN 500 MG PO TABS
ORAL_TABLET | ORAL | Status: AC
  Filled 2024-10-01: qty 2

## 2024-10-01 MED ORDER — FENTANYL CITRATE (PF) 100 MCG/2ML IJ SOLN
INTRAMUSCULAR | Status: AC
  Filled 2024-10-01: qty 2

## 2024-10-01 MED ORDER — BUPIVACAINE LIPOSOME 1.3 % IJ SUSP
INTRAMUSCULAR | Status: AC
Start: 2024-10-01 — End: 2024-10-01
  Filled 2024-10-01: qty 20

## 2024-10-01 MED ORDER — SODIUM CHLORIDE (PF) 0.9 % IJ SOLN
INTRAMUSCULAR | Status: AC
  Filled 2024-10-01: qty 50

## 2024-10-01 MED ORDER — CEFAZOLIN SODIUM-DEXTROSE 2-4 GM/100ML-% IV SOLN
INTRAVENOUS | Status: AC
  Filled 2024-10-01: qty 100

## 2024-10-01 MED ORDER — CELECOXIB 200 MG PO CAPS
ORAL_CAPSULE | ORAL | Status: AC
  Filled 2024-10-01: qty 1

## 2024-10-01 MED ORDER — DROPERIDOL 2.5 MG/ML IJ SOLN: 0.6250 mg | Freq: Once | INTRAMUSCULAR | Status: DC | PRN

## 2024-10-01 MED ORDER — CEFAZOLIN SODIUM-DEXTROSE 2-4 GM/100ML-% IV SOLN
2.0000 g | INTRAVENOUS | Status: AC
  Administered 2024-10-01: 2 g via INTRAVENOUS

## 2024-10-01 MED ORDER — LIDOCAINE 2% (20 MG/ML) 5 ML SYRINGE
INTRAMUSCULAR | Status: DC | PRN
  Administered 2024-10-01: 100 mg via INTRAVENOUS

## 2024-10-01 MED ORDER — OXYCODONE HCL 5 MG PO TABS
5.0000 mg | ORAL_TABLET | Freq: Once | ORAL | Status: AC | PRN
  Administered 2024-10-01: 5 mg via ORAL

## 2024-10-01 MED ORDER — ROCURONIUM BROMIDE 10 MG/ML (PF) SYRINGE
PREFILLED_SYRINGE | INTRAVENOUS | Status: AC
Start: 2024-10-01 — End: 2024-10-01
  Filled 2024-10-01: qty 10

## 2024-10-01 MED ORDER — SODIUM CHLORIDE (PF) 0.9 % IJ SOLN
INTRAMUSCULAR | Status: DC | PRN
  Administered 2024-10-01: 100 mL

## 2024-10-01 MED ORDER — OXYCODONE HCL 5 MG PO TABS
ORAL_TABLET | ORAL | Status: AC
  Filled 2024-10-01: qty 1

## 2024-10-01 MED ORDER — SUGAMMADEX SODIUM 200 MG/2ML IV SOLN
INTRAVENOUS | Status: DC | PRN
  Administered 2024-10-01: 250 mg via INTRAVENOUS

## 2024-10-01 MED ORDER — 0.9 % SODIUM CHLORIDE (POUR BTL) OPTIME
TOPICAL | Status: DC | PRN
  Administered 2024-10-01 (×3): 1000 mL

## 2024-10-01 SURGICAL SUPPLY — 58 items
BINDER BREAST 3XL (GAUZE/BANDAGES/DRESSINGS) IMPLANT
BINDER BREAST LRG (GAUZE/BANDAGES/DRESSINGS) IMPLANT
BINDER BREAST MEDIUM (GAUZE/BANDAGES/DRESSINGS) IMPLANT
BINDER BREAST XLRG (GAUZE/BANDAGES/DRESSINGS) IMPLANT
BINDER BREAST XXLRG (GAUZE/BANDAGES/DRESSINGS) IMPLANT
BIOPATCH RED 1 DISK 7.0 (GAUZE/BANDAGES/DRESSINGS) ×2 IMPLANT
BLADE HEX COATED 2.75 (ELECTRODE) IMPLANT
BLADE SURG 10 STRL SS (BLADE) ×6 IMPLANT
BLADE SURG 15 STRL LF DISP TIS (BLADE) ×1 IMPLANT
CANISTER SUCT 1200ML W/VALVE (MISCELLANEOUS) IMPLANT
COTTONBALL LRG STERILE PKG (GAUZE/BANDAGES/DRESSINGS) IMPLANT
DERMABOND ADVANCED .7 DNX12 (GAUZE/BANDAGES/DRESSINGS) ×4 IMPLANT
DRAIN CHANNEL 19F RND (DRAIN) ×2 IMPLANT
DRAPE IMP U-DRAPE 54X76 (DRAPES) IMPLANT
DRAPE UTILITY XL STRL (DRAPES) ×1 IMPLANT
DRSG TEGADERM 4X4.75 (GAUZE/BANDAGES/DRESSINGS) ×2 IMPLANT
ELECTRODE BLDE 4.0 EZ CLN MEGD (MISCELLANEOUS) ×1 IMPLANT
ELECTRODE REM PT RTRN 9FT ADLT (ELECTROSURGICAL) ×2 IMPLANT
EVACUATOR SILICONE 100CC (DRAIN) ×2 IMPLANT
GAUZE PAD ABD 8X10 STRL (GAUZE/BANDAGES/DRESSINGS) ×2 IMPLANT
GAUZE SPONGE 2X2 STRL 8-PLY (GAUZE/BANDAGES/DRESSINGS) ×2 IMPLANT
GAUZE XEROFORM 5X9 LF (GAUZE/BANDAGES/DRESSINGS) IMPLANT
GLOVE BIO SURGEON STRL SZ 6.5 (GLOVE) IMPLANT
GLOVE BIO SURGEON STRL SZ7.5 (GLOVE) IMPLANT
GLOVE BIO SURGEON STRL SZ8 (GLOVE) ×1 IMPLANT
GLOVE BIOGEL PI IND STRL 7.0 (GLOVE) IMPLANT
GLOVE BIOGEL PI IND STRL 8 (GLOVE) ×1 IMPLANT
GOWN STRL REUS W/ TWL LRG LVL3 (GOWN DISPOSABLE) ×1 IMPLANT
GOWN STRL REUS W/TWL XL LVL3 (GOWN DISPOSABLE) ×1 IMPLANT
HIBICLENS CHG 4% 4OZ BTL (MISCELLANEOUS) ×1 IMPLANT
MANIFOLD NEPTUNE II (INSTRUMENTS) ×1 IMPLANT
MARKER SKIN DUAL TIP RULER LAB (MISCELLANEOUS) ×1 IMPLANT
NDL HYPO 22X1.5 SAFETY MO (MISCELLANEOUS) ×2 IMPLANT
NEEDLE HYPO 22X1.5 SAFETY MO (MISCELLANEOUS) ×2 IMPLANT
PACK BASIN DAY SURGERY FS (CUSTOM PROCEDURE TRAY) ×1 IMPLANT
PACK UNIVERSAL I (CUSTOM PROCEDURE TRAY) ×1 IMPLANT
PENCIL SMOKE EVACUATOR (MISCELLANEOUS) ×2 IMPLANT
PIN SAFETY STERILE (MISCELLANEOUS) ×1 IMPLANT
SLEEVE SCD COMPRESS KNEE MED (STOCKING) ×1 IMPLANT
SOLN 0.9% NACL POUR BTL 1000ML (IV SOLUTION) ×1 IMPLANT
SPONGE T-LAP 18X18 ~~LOC~~+RFID (SPONGE) ×3 IMPLANT
STAPLER SKIN PROX WIDE 3.9 (STAPLE) ×1 IMPLANT
STRIP CLOSURE SKIN 1/2X4 (GAUZE/BANDAGES/DRESSINGS) IMPLANT
SUT MNCRL AB 3-0 PS2 27 (SUTURE) ×4 IMPLANT
SUT MNCRL AB 4-0 PS2 18 (SUTURE) ×4 IMPLANT
SUT MON AB 2-0 CT1 36 (SUTURE) ×1 IMPLANT
SUT MON AB 5-0 PS2 18 (SUTURE) IMPLANT
SUT SILK 2 0 SH (SUTURE) ×2 IMPLANT
SUT VIC AB 3-0 SH 27X BRD (SUTURE) IMPLANT
SYR 20ML LL LF (SYRINGE) ×2 IMPLANT
SYR BULB IRRIG 60ML STRL (SYRINGE) IMPLANT
SYR CONTROL 10ML LL (SYRINGE) ×1 IMPLANT
TAPE MEASURE VINYL STERILE (MISCELLANEOUS) IMPLANT
TOWEL GREEN STERILE FF (TOWEL DISPOSABLE) ×2 IMPLANT
TRAY DSU PREP LF (CUSTOM PROCEDURE TRAY) ×1 IMPLANT
TUBE CONNECTING 20X1/4 (TUBING) ×1 IMPLANT
UNDERPAD 30X36 HEAVY ABSORB (UNDERPADS AND DIAPERS) ×2 IMPLANT
YANKAUER SUCT BULB TIP NO VENT (SUCTIONS) ×1 IMPLANT

## 2024-10-01 NOTE — Transfer of Care (Signed)
 Immediate Anesthesia Transfer of Care Note  Patient: Peggy Shaw  Procedure(s) Performed: MAMMOPLASTY, REDUCTION (Bilateral: Breast)  Patient Location: PACU  Anesthesia Type:General  Level of Consciousness: drowsy  Airway & Oxygen Therapy: Patient Spontanous Breathing and Patient connected to face mask oxygen  Post-op Assessment: Report given to RN and Post -op Vital signs reviewed and stable  Post vital signs: Reviewed and stable  Last Vitals:  Vitals Value Taken Time  BP 121/72 (87)   Temp    Pulse 59 10/01/24 11:58  Resp 12 10/01/24 11:58  SpO2 100 % 10/01/24 11:58  Vitals shown include unfiled device data.  Last Pain:  Vitals:   10/01/24 0627  TempSrc: Oral  PainSc: 0-No pain      Patients Stated Pain Goal: 4 (10/01/24 9372)  Complications: No notable events documented.

## 2024-10-01 NOTE — Discharge Instructions (Addendum)
 Activity: Avoid strenuous activity.  No lifting, pushing, or pulling greater than 15 pounds.  Diet: No restrictions.  Try to optimize nutrition with plenty of proteins, fruits, and vegetables to improve healing.   Wound Care: Leave breast binder on for the first week and then you may transition to a front-clipping or front-zipping compression bra.  Sponge bathe only until our visit tomorrow, then we can discuss transition to showering with the emphasis on keeping the surgical site dry.  Replace the ABD pads over incisions with gauze or Maxi pads as needed for incisional drainage.   If you have drains placed, be sure to record the daily output from each side. They will be removed at your appointment tomorrow. Please make sure that the bulbs are charged.  If you experience any issues, please call the office.    Follow-Up: Scheduled for tomorrow.  Things to watch for:  Call the office if you experience fever, chills, intractable vomiting, or significant bleeding.  Mild wound drainage is common after breast reduction surgery and should not be cause for alarm.\   Post Anesthesia Home Care Instructions  Activity: Get plenty of rest for the remainder of the day. A responsible individual must stay with you for 24 hours following the procedure.  For the next 24 hours, DO NOT: -Drive a car -Advertising copywriter -Drink alcoholic beverages -Take any medication unless instructed by your physician -Make any legal decisions or sign important papers.  Meals: Start with liquid foods such as gelatin or soup. Progress to regular foods as tolerated. Avoid greasy, spicy, heavy foods. If nausea and/or vomiting occur, drink only clear liquids until the nausea and/or vomiting subsides. Call your physician if vomiting continues.  Special Instructions/Symptoms: Your throat may feel dry or sore from the anesthesia or the breathing tube placed in your throat during surgery. If this causes discomfort, gargle with warm  salt water . The discomfort should disappear within 24 hours.  May have Tylenol  after 1pm if needed.     Information for Discharge Teaching: EXPAREL  (bupivacaine  liposome injectable suspension)   Pain relief is important to your recovery. The goal is to control your pain so you can move easier and return to your normal activities as soon as possible after your procedure. Your physician may use several types of medicines to manage pain, swelling, and more.  Your surgeon or anesthesiologist gave you EXPAREL (bupivacaine ) to help control your pain after surgery.  EXPAREL  is a local anesthetic designed to release slowly over an extended period of time to provide pain relief by numbing the tissue around the surgical site. EXPAREL  is designed to release pain medication over time and can control pain for up to 72 hours. Depending on how you respond to EXPAREL , you may require less pain medication during your recovery. EXPAREL  can help reduce or eliminate the need for opioids during the first few days after surgery when pain relief is needed the most. EXPAREL  is not an opioid and is not addictive. It does not cause sleepiness or sedation.   Important! A teal colored band has been placed on your arm with the date, time and amount of EXPAREL  you have received. Please leave this armband in place for the full 96 hours following administration, and then you may remove the band. If you return to the hospital for any reason within 96 hours following the administration of EXPAREL , the armband provides important information that your health care providers to know, and alerts them that you have received this anesthetic.  Possible side effects of EXPAREL : Temporary loss of sensation or ability to move in the area where medication was injected. Nausea, vomiting, constipation Rarely, numbness and tingling in your mouth or lips, lightheadedness, or anxiety may occur. Call your doctor right away if you think you may  be experiencing any of these sensations, or if you have other questions regarding possible side effects.  Follow all other discharge instructions given to you by your surgeon or nurse. Eat a healthy diet and drink plenty of water  or other fluids.  About my Jackson-Pratt Bulb Drain  What is a Jackson-Pratt bulb? A Jackson-Pratt is a soft, round device used to collect drainage. It is connected to a long, thin drainage catheter, which is held in place by one or two small stiches near your surgical incision site. When the bulb is squeezed, it forms a vacuum, forcing the drainage to empty into the bulb.  Emptying the Jackson-Pratt bulb- To empty the bulb: 1. Release the plug on the top of the bulb. 2. Pour the bulb's contents into a measuring container which your nurse will provide. 3. Record the time emptied and amount of drainage. Empty the drain(s) as often as your     doctor or nurse recommends.  Date                  Time                    Amount (Drain 1)                 Amount (Drain 2)  _____________________________________________________________________  _____________________________________________________________________  _____________________________________________________________________  _____________________________________________________________________  _____________________________________________________________________  _____________________________________________________________________  _____________________________________________________________________  _____________________________________________________________________  Squeezing the Jackson-Pratt Bulb- To squeeze the bulb: 1. Make sure the plug at the top of the bulb is open. 2. Squeeze the bulb tightly in your fist. You will hear air squeezing from the bulb. 3. Replace the plug while the bulb is squeezed. 4. Use a safety pin to attach the bulb to your clothing. This will keep the catheter from      pulling at the bulb insertion site.  When to call your doctor- Call your doctor if: Drain site becomes red, swollen or hot. You have a fever greater than 101 degrees F. There is oozing at the drain site. Drain falls out (apply a guaze bandage over the drain hole and secure it with tape). Drainage increases daily not related to activity patterns. (You will usually have more drainage when you are active than when you are resting.) Drainage has a bad odor.

## 2024-10-01 NOTE — Interval H&P Note (Signed)
 History and Physical Interval Note: No change in exam or indication for surgery All questions answered Marked for a bilateral breast reduction with her assistance Will proceed at her request  10/01/2024 7:16 AM  Peggy Shaw  has presented today for surgery, with the diagnosis of N62.  The various methods of treatment have been discussed with the patient and family. After consideration of risks, benefits and other options for treatment, the patient has consented to  Procedure(s): MAMMOPLASTY, REDUCTION (Bilateral) as a surgical intervention.  The patient's history has been reviewed, patient examined, no change in status, stable for surgery.  I have reviewed the patient's chart and labs.  Questions were answered to the patient's satisfaction.     [name]

## 2024-10-01 NOTE — Anesthesia Preprocedure Evaluation (Addendum)
 Anesthesia Evaluation  Patient identified by MRN, date of birth, ID band Patient awake    Reviewed: Allergy & Precautions, NPO status , Patient's Chart, lab work & pertinent test results  Airway Mallampati: II  TM Distance: >3 FB Neck ROM: Full    Dental no notable dental hx.    Pulmonary neg pulmonary ROS, former smoker   Pulmonary exam normal        Cardiovascular negative cardio ROS  Rhythm:Regular Rate:Normal     Neuro/Psych  Headaches  negative psych ROS   GI/Hepatic negative GI ROS, Neg liver ROS,,,  Endo/Other  negative endocrine ROS    Renal/GU negative Renal ROS  negative genitourinary   Musculoskeletal Macromastia    Abdominal Normal abdominal exam  (+)   Peds  Hematology  (+) Blood dyscrasia, anemia Lab Results      Component                Value               Date                      WBC                      5.3                 04/18/2024                HGB                      11.3                04/18/2024                HCT                      36.5                04/18/2024                MCV                      90                  04/18/2024                PLT                      269                 04/18/2024              Anesthesia Other Findings   Reproductive/Obstetrics Lab Results      Component                Value               Date                      PREGTESTUR               NEGATIVE            10/01/2024  Anesthesia Physical Anesthesia Plan  ASA: 2  Anesthesia Plan: General   Post-op Pain Management: Tylenol  PO (pre-op)* and Celebrex PO (pre-op)*   Induction: Intravenous  PONV Risk Score and Plan: 3 and Ondansetron , Dexamethasone, Midazolam and Treatment may vary due to age or medical condition  Airway Management Planned: Mask and Oral ETT  Additional Equipment: None  Intra-op Plan:   Post-operative  Plan: Extubation in OR  Informed Consent: I have reviewed the patients History and Physical, chart, labs and discussed the procedure including the risks, benefits and alternatives for the proposed anesthesia with the patient or authorized representative who has indicated his/her understanding and acceptance.     Dental advisory given  Plan Discussed with: CRNA  Anesthesia Plan Comments:         Anesthesia Quick Evaluation

## 2024-10-01 NOTE — Op Note (Signed)
 DATE OF OPERATION: 10/01/2024   LOCATION: [facility] operating Room   PREOPERATIVE DIAGNOSIS: Symptomatic macromastia   POSTOPERATIVE DIAGNOSIS: Same   PROCEDURE: Bilateral breast reduction   SURGEON: Marinell Birmingham, MD   ASSISTANT: Honora Seip   EBL: 150 cc   CONDITION: Stable   COMPLICATIONS: None   INDICATION: The patient, Peggy Shaw, is a 33 y.o. female born on 1991/11/06, is here for treatment upper back and neck pain secondary to large breast size.     PROCEDURE DETAILS:  The patient was seen prior to surgery and marked.  IV antibiotics were given. The patient was taken to the operating room,SCDs were placed, and given a general anesthetic. A standard time out was performed and all information was confirmed by those in the room.    The chest was prepped and draped in usual sterile manner.  A 42 mm cookie cutter was used to outline the proposed nipple areolar complexes bilaterally and an 8 cm based inferior pedicle was outlined on each breast.  The right breast was addressed first.  Laparotomy tape was placed at the base of breast as a tourniquet and the pedicle was de-epithelialized sharply.  Electrocautery was used to dissect the borders of the pedicle to the chest wall.  Electrocautery was then used to resect the medial lateral and superior triangles of breast tissue and to develop within the superior skin flap.  The breast tissue removed constituted the bulk of the reduction and weighed 1117gms.  The subfascial space over the pectoralis muscle and the subcutaneous tissues were infiltrated with 50 mL of a mixture of Exparel , quarter percent plain Marcaine , and saline.  The surgical wound was irrigated and hemostasis ensured with electrocautery.  A 19 French round drain was placed behind the pedicle and brought out through a separate stab incision.  The T point was approximated with a single 2-0 Monocryl suture and the skin edges were tailor tacked in place with skin  clips.  Dermis was approximated with interrupted and running 3-0 Monocryl sutures and the skin was closed with a running 4-0 Monocryl subcuticular stitch.  Attention was turned to the left breast where similar procedure was performed.  After placing a laparotomy tape at the base of the breast as a tourniquet the pedicle was de-epithelialized sharply and dissected to the chest wall with electrocautery.  Medial lateral and superior triangles of breast tissue were resected and the superior skin flap was developed and thinned.  The breast tissue removed constituted the bulk of the reduction and weighed 1262 gms.  All breast tissue was sent to pathology for routine examination.  Again the subfascial space was infiltrated with the Exparel  mixture and the surgical bed irrigated.  After ensuring hemostasis a 19 French round drain was placed behind the pedicle and brought out through a separate stab incision. The T point was approximated with a 2-0 Monocryl suture and the skin edges were tailor tacked in place with skin clips.  Dermis was closed with interrupted and running 3-0 Monocryl sutures and skin was closed with running 4-0 Monocryl subcuticular stitch.  All incisions were then sealed with Dermabond the patient was placed in a supportive, compressive garment.  She was awakened from anesthesia without incident and transferred to the recovery room in good condition.  All instrument needle and sponge counts were reported as correct and there were no complications appreciated during the procedure. The patient was allowed to wake up and taken to recovery room in stable condition at  the end of the case. The family was notified at the end of the case.    The advanced practice practitioner (APP) assisted throughout the case.  The APP was essential in retraction and counter traction when needed to make the case progress smoothly.  This retraction and assistance made it possible to see the tissue plans for the procedure.   The assistance was needed for blood control, tissue re-approximation and assisted with closure of the incision site.

## 2024-10-01 NOTE — Anesthesia Postprocedure Evaluation (Signed)
 Anesthesia Post Note  Patient: Peggy Shaw  Procedure(s) Performed: MAMMOPLASTY, REDUCTION (Bilateral: Breast)     Patient location during evaluation: PACU Anesthesia Type: General Level of consciousness: awake and alert Pain management: pain level controlled Vital Signs Assessment: post-procedure vital signs reviewed and stable Respiratory status: spontaneous breathing, nonlabored ventilation, respiratory function stable and patient connected to nasal cannula oxygen Cardiovascular status: blood pressure returned to baseline and stable Postop Assessment: no apparent nausea or vomiting Anesthetic complications: no   No notable events documented.  Last Vitals:  Vitals:   10/01/24 1230 10/01/24 1244  BP: 112/64 132/79  Pulse: 63 60  Resp: 13   Temp:  (!) 36.2 C  SpO2: 98% 100%    Last Pain:  Vitals:   10/01/24 1244  TempSrc: Temporal  PainSc: 7                  Aljean Horiuchi P Ajai Terhaar

## 2024-10-01 NOTE — Anesthesia Procedure Notes (Signed)
 Procedure Name: Intubation Date/Time: 10/01/2024 7:41 AM  Performed by: Debarah Chiquita LABOR, CRNAPre-anesthesia Checklist: Patient identified, Emergency Drugs available, Suction available and Patient being monitored Patient Re-evaluated:Patient Re-evaluated prior to induction Oxygen Delivery Method: Circle system utilized Preoxygenation: Pre-oxygenation with 100% oxygen Induction Type: IV induction Ventilation: Mask ventilation without difficulty and Oral airway inserted - appropriate to patient size Laryngoscope Size: Mac and 3 Grade View: Grade I Tube type: Oral Tube size: 7.0 mm Number of attempts: 1 Airway Equipment and Method: Stylet, Oral airway and Bite block Placement Confirmation: ETT inserted through vocal cords under direct vision, positive ETCO2 and breath sounds checked- equal and bilateral Secured at: 21 cm Tube secured with: Tape Dental Injury: Teeth and Oropharynx as per pre-operative assessment

## 2024-10-02 ENCOUNTER — Encounter (HOSPITAL_BASED_OUTPATIENT_CLINIC_OR_DEPARTMENT_OTHER): Payer: Self-pay | Admitting: Plastic Surgery

## 2024-10-02 ENCOUNTER — Ambulatory Visit: Admitting: Physician Assistant

## 2024-10-02 VITALS — BP 118/76 | HR 67

## 2024-10-02 DIAGNOSIS — Z9889 Other specified postprocedural states: Secondary | ICD-10-CM

## 2024-10-02 LAB — SURGICAL PATHOLOGY

## 2024-10-02 MED ORDER — OXYCODONE HCL 5 MG PO TABS: 5.0000 mg | ORAL_TABLET | Freq: Three times a day (TID) | ORAL | 0 refills | Status: AC | PRN

## 2024-10-02 NOTE — Progress Notes (Signed)
 Patient is a pleasant 33 year old female s/p bilateral breast reduction performed 10/01/2024 by Dr. Waddell who presents to clinic for postoperative follow-up.  Reviewed operative report and 1117 g removed from the right breast, 1262 g removed from the left breast.  Today, patient is doing exceedingly well from a postoperative standpoint.  She has been tolerating her discomfort with Tylenol  alone, but would appreciate some postoperative oxycodone.  She states that it was not filled as it likely expired after it was initially prescribed.  She drove here today independently.  She denies any nausea, chest pain, difficulty breathing, or leg swelling.  She is ambulatory at home and wearing her compressive binder which she finds comfortable.  On exam, breasts have excellent shape and symmetry.  No palpable underlying fluid collection concerning for seroma or hematoma.  No significant ecchymoses or other overlying skin changes.  Incision CDI throughout.  NAC's are warm and with good color.  No evidence of ischemia.  Replaced ABD pads.  Drains had scant output.  Drains removed without complication at bedside, well-tolerated by patient.  Recommending continued activity modifications and compressive garments.  Follow-up next week, as scheduled.  She will call the office should she have any questions or concerns in the interim.

## 2024-10-07 ENCOUNTER — Ambulatory Visit: Admitting: Plastic Surgery

## 2024-10-07 DIAGNOSIS — Z9889 Other specified postprocedural states: Secondary | ICD-10-CM

## 2024-10-07 NOTE — Progress Notes (Signed)
 Peggy Shaw returns today approximately 1 week postop from her bilateral breast reduction.  She is doing well with no complaints.  Her narcotic pain medicine was never actually transferred to the pharmacy of her choice and she has never picked it up.  She has used only Tylenol  and Motrin  since surgery.  She is overall happy with the results.  No specific complaints.  She does have decreased feeling in the right nipple.  On examination all incisions are clean dry and intact.  Nipples are warm and well-perfused.  She has nice shape and symmetry.  Status post bilateral breast reduction: Doing well slowly increase activity.  She may sleep in any position that she likes.  She may walk and ride the stationary bicycle without limitation.  I would like for her to wait until 6 weeks to begin heavy lifting though.  Keep scheduled follow-up appointment follow-up sooner if needed.

## 2024-10-21 ENCOUNTER — Encounter: Admitting: Physician Assistant

## 2024-10-28 ENCOUNTER — Encounter: Admitting: Physician Assistant

## 2024-11-06 ENCOUNTER — Ambulatory Visit (INDEPENDENT_AMBULATORY_CARE_PROVIDER_SITE_OTHER): Admitting: Physician Assistant

## 2024-11-06 VITALS — BP 116/71 | HR 63

## 2024-11-06 DIAGNOSIS — Z9889 Other specified postprocedural states: Secondary | ICD-10-CM

## 2024-11-06 NOTE — Progress Notes (Signed)
 Patient is a pleasant 33 year old female s/p bilateral breast reduction performed 10/01/2024 by Dr. Waddell who presents to clinic for postoperative follow-up.   She was last seen here in clinic on 10/07/2024.  At that time, decreased sensation noted to right areola, but otherwise exam was entirely benign.  Follow-up as scheduled.  Today, patient is doing well from a postoperative standpoint.  She still does not have any sensitivity involving her right areola, but otherwise no issues or complaints.  She is overall very pleased with the outcome of her breast reduction surgery.  On exam, breast with excellent shape and symmetry.  Soft throughout.  NAC's are healthy and viable, good perfusion and capillary refill noted.  Incisions CDI throughout.  Superficial wound at right inferior T-zone and areas where sutures were removed left areola, but otherwise entirely healed.  Recommending Vaseline application to the incisions for another few days and then transition to silicone scar gel twice daily x 3 months.  She is approximately 5 weeks postop.  Recommending another week of activity restrictions, but then she is free to resume normal activity.  Picture(s) obtained of the patient and placed in the chart were with the patient's or guardian's permission.
